# Patient Record
Sex: Male | Born: 1955 | Race: Black or African American | Hispanic: No | Smoking: Heavy tobacco smoker
Health system: Southern US, Community
[De-identification: ages and names within clinical notes are randomized; demographics above are authoritative.]

## PROBLEM LIST (undated history)

## (undated) DIAGNOSIS — E119 Type 2 diabetes mellitus without complications: Secondary | ICD-10-CM

## (undated) DIAGNOSIS — F1910 Other psychoactive substance abuse, uncomplicated: Secondary | ICD-10-CM

## (undated) DIAGNOSIS — T401X1A Poisoning by heroin, accidental (unintentional), initial encounter: Secondary | ICD-10-CM

---

## 2006-07-11 ENCOUNTER — Encounter: Payer: Self-pay | Admitting: Emergency Medicine

## 2006-07-12 ENCOUNTER — Inpatient Hospital Stay (HOSPITAL_COMMUNITY): Admission: EM | Admit: 2006-07-12 | Discharge: 2006-07-14 | Payer: Self-pay | Admitting: Internal Medicine

## 2006-09-04 ENCOUNTER — Ambulatory Visit (HOSPITAL_COMMUNITY): Admission: RE | Admit: 2006-09-04 | Discharge: 2006-09-04 | Payer: Self-pay | Admitting: Urology

## 2006-11-02 ENCOUNTER — Encounter (INDEPENDENT_AMBULATORY_CARE_PROVIDER_SITE_OTHER): Payer: Self-pay | Admitting: Specialist

## 2006-11-02 ENCOUNTER — Ambulatory Visit (HOSPITAL_COMMUNITY): Admission: RE | Admit: 2006-11-02 | Discharge: 2006-11-03 | Payer: Self-pay | Admitting: Urology

## 2007-11-19 ENCOUNTER — Emergency Department (HOSPITAL_COMMUNITY): Admission: EM | Admit: 2007-11-19 | Discharge: 2007-11-19 | Payer: Self-pay | Admitting: Emergency Medicine

## 2009-08-07 IMAGING — CR DG SHOULDER 2+V*L*
3 series · 3 of 3 positions shown · non-contrast
Comparison: none

Left shoulder, 3 views, 11/19/2007, 8552 hours.
INDICATION: EtOH, arm pain, syncope, pain and numbness in both arms into
fingers.

[w shoulder ap internal left]
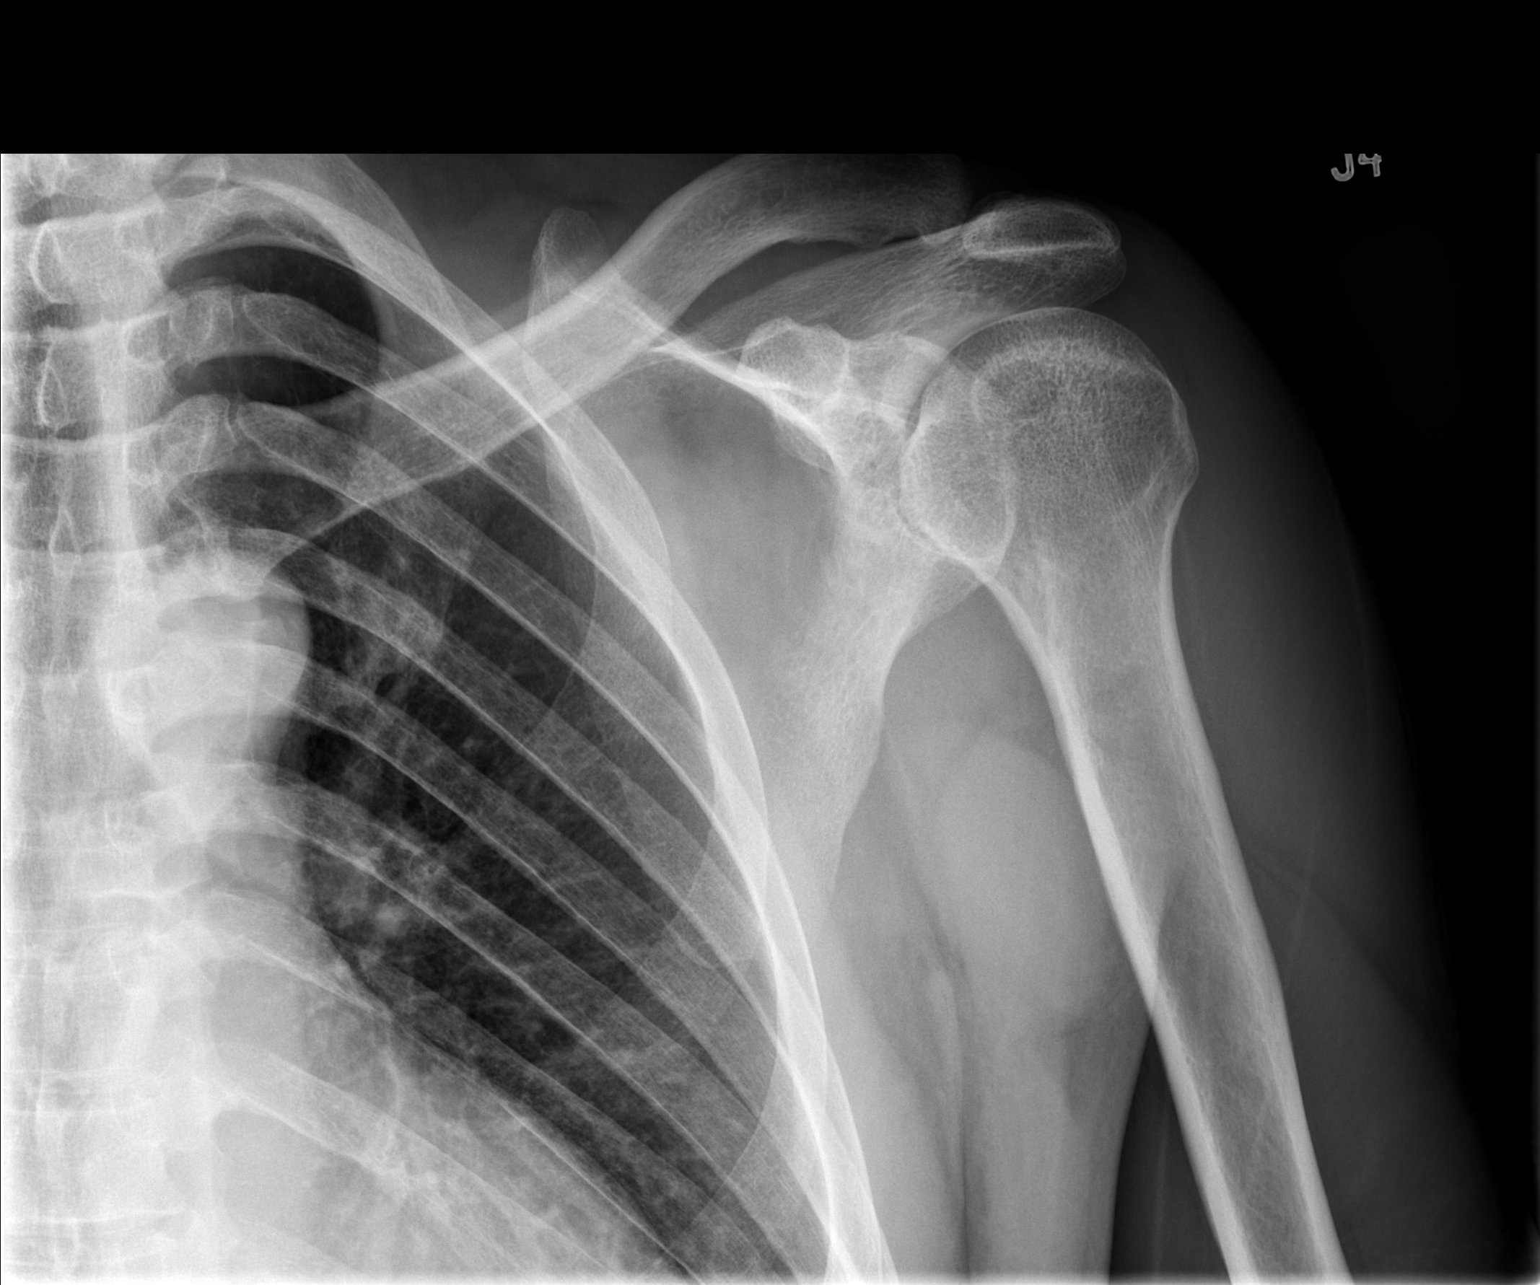

[w shoulder ap external left]
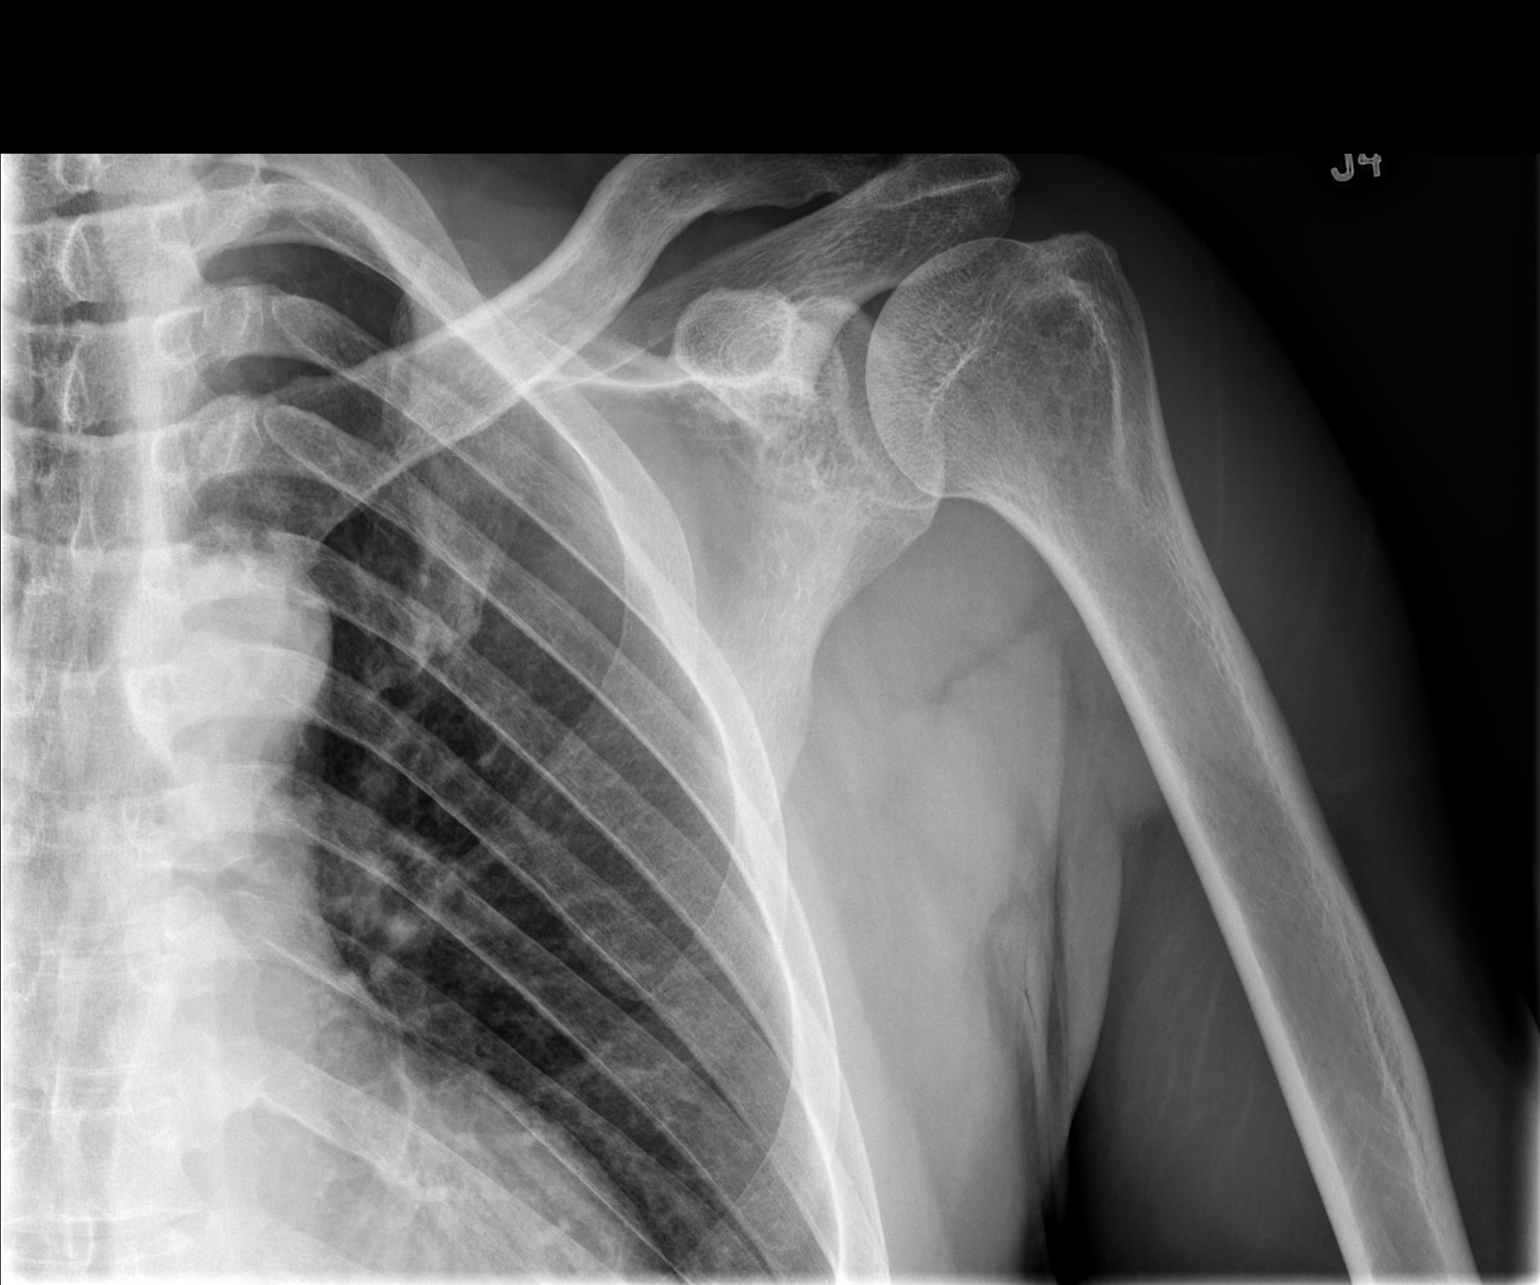

[w shoulder y view left]
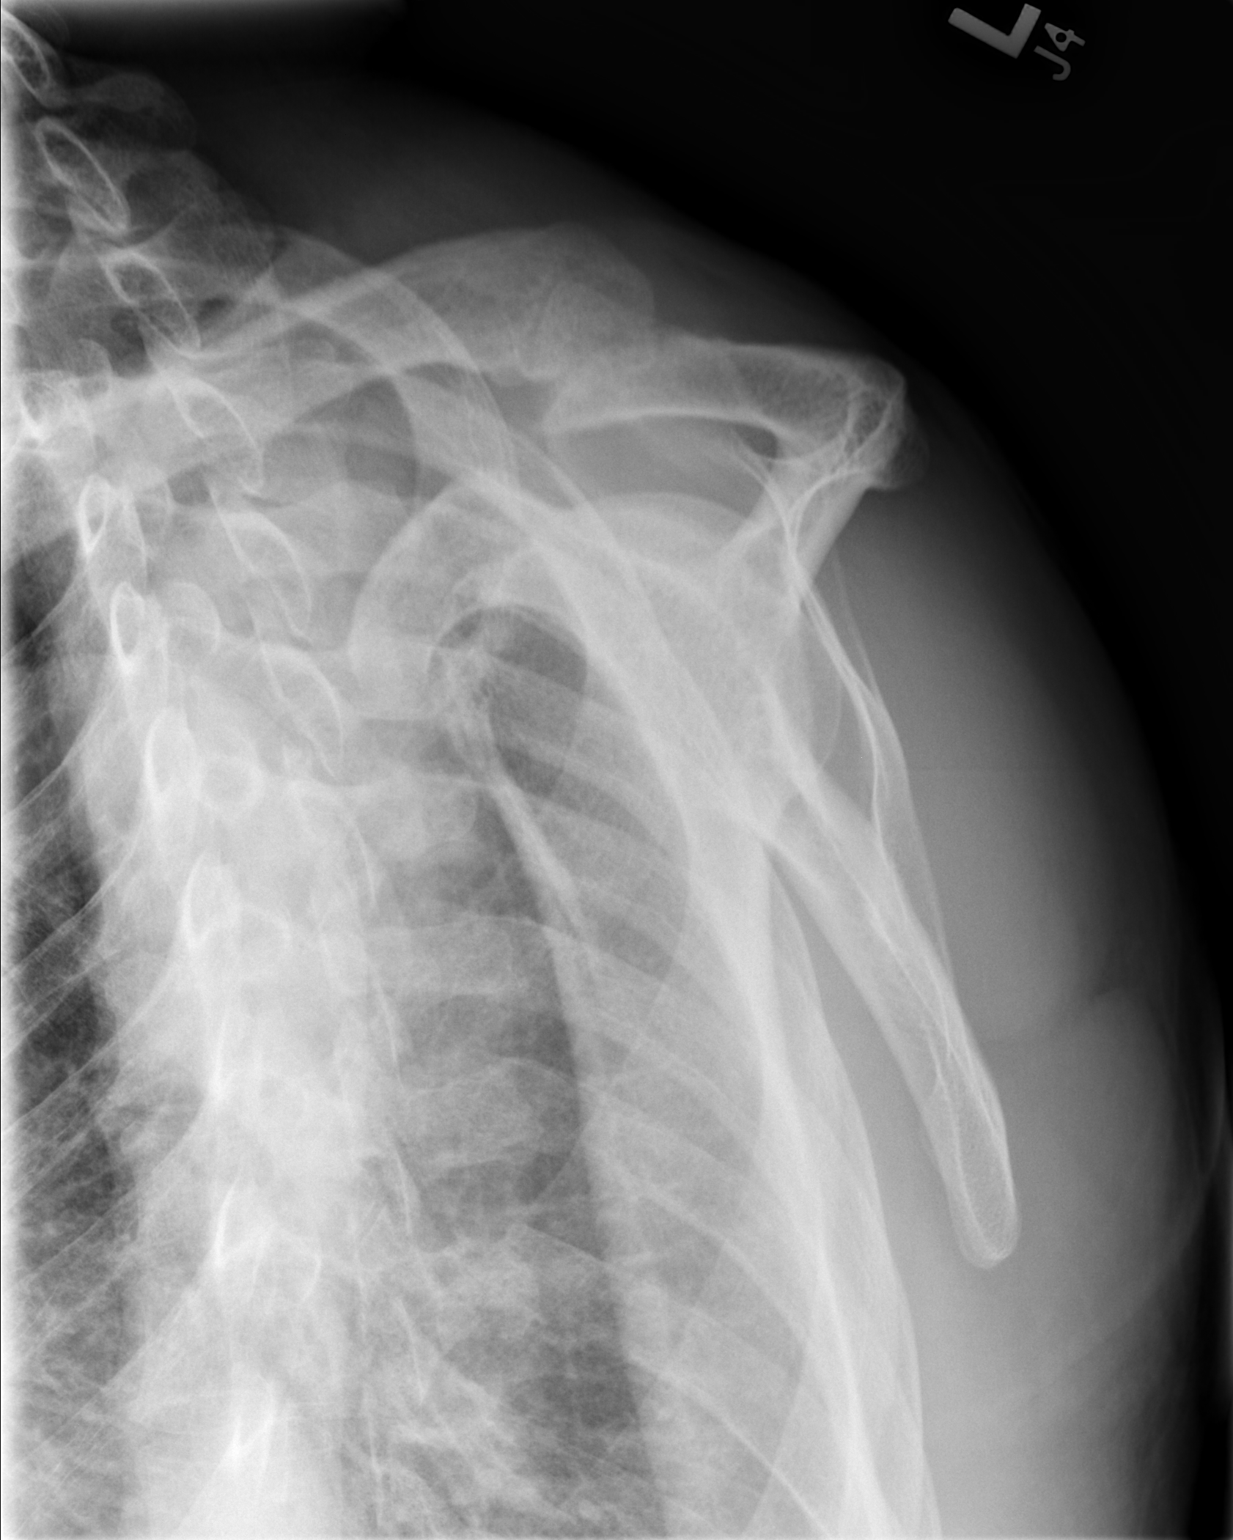

[3 of 3 positions shown; findings below may reference images not displayed]

FINDINGS: Left shoulder located. Internal and external rotation views normal. No
fracture. Type II acromion.
IMPRESSION: No acute injury left shoulder.

## 2010-11-14 ENCOUNTER — Encounter: Payer: Self-pay | Admitting: Urology

## 2011-03-11 NOTE — Op Note (Signed)
Dominic Molina, Dominic Molina              ACCOUNT NO.:  [id number]   MEDICAL RECORD NO.:  [id number]          PATIENT TYPE:  OIB   LOCATION:  0098                         FACILITY:  [facility]   PHYSICIAN:  Heloise Purpura, MD      DATE OF BIRTH:  Sep 16, 1956   DATE OF PROCEDURE:  11/02/2006  DATE OF DISCHARGE:                               OPERATIVE REPORT   PREOPERATIVE DIAGNOSIS:  Bladder tumor.   POSTOPERATIVE DIAGNOSIS:  Bladder tumor.   PROCEDURES:  1. Cystoscopy.  2. Saline bladder washing.  3. Bilateral retrograde pyelography.  4. Transurethral resection of bladder tumor (medium).  5. Biopsy prostatic urethra.  6. Examination under anesthesia.   SURGEON:  Dr. Heloise Purpura.   ANESTHESIA:  General.   COMPLICATIONS:  None.   ESTIMATED BLOOD LOSS:  Minimal.   SPECIMENS:  1. Saline washing for bladder cytology.  2. Bladder tumor.  3. Biopsy of tumor base.  4. Biopsy of prostatic urethra.   INDICATIONS:  Mr. Moncada is a 55 year old gentleman who recently  presented with hematuria.  He underwent evaluation and was found to have  a bladder tumor on the left lateral aspect of his bladder.  After  undergoing thorough counseling, it was explained to him that he did need  to undergo a cystoscopic transurethral resection for staging purposes as  well as further evaluation of his upper urinary tract.  Although the  patient failed to show twice for his surgery and follow-up, he did  decide to finally proceed with a staging evaluation.  Potential risks  and benefits of the above procedures were explained to the patient and  he consented.   DESCRIPTION OF PROCEDURE:  The patient was taken to the operating room  and a general anesthetic was administered.  He was given preoperative  antibiotics, placed in the dorsal lithotomy position, prepped and draped  in the usual sterile fashion.  Preoperative time-out was performed.  Paralysis was administered.  Cystoscopy was performed with the 30  and 70  degrees lens.  This demonstrated a normal anterior urethra.  The  prostatic urethra did demonstrate some frondular tissue in the area of  the verumontanum.  The bladder was then systematically examined.  Both  ureteral orifices were in normal anatomic position and effluxing clear  urine.  There was noted to be a 3-4 cm papillary bladder tumor with a  wide base just lateral to the left ureteral orifice.  The remainder the  bladder was free of any tumors, stones, or other mucosal pathology.  A  saline bladder washing was obtained.  Attention then turned the right  ureteral orifice which was intubated with a 6-French ureteral catheter.  A retrograde pyelogram was performed which did not demonstrate any  abnormalities.  The left ureteral orifice was then also intubated and  injected with contrast and again there were no abnormalities noted.  Attention then turned to resection of the bladder tumor.  The cystoscope  was removed and replaced with the 24-French resectoscope.  Using loupe  cutting resection the bladder tumor was carefully resected down to its  base.  Once all  visible bladder tumor was removed.  The specimen was  sent as bladder tumor.  The cold cup biopsy forceps were then used to  take separate biopsies of the tumor base with deep muscular bites.  Electrocautery was then used to fulgurate any bleeding from the bladder  tumor site.  A 6-French ureteral catheter was used to intubate the  ureteral orifice to ensure no violation of the intravesical ureter.  Attention then turned to the prostatic urethra.  The cold cup biopsy  forceps were used to biopsy the prostatic urethra and aforementioned  frondular tissue in the area of the verumontanum.  These areas were then  fulgurated to provide hemostasis.  The bladder was then reexamined after  it was emptied.  Hemostasis appeared excellent.  The bladder was then  emptied completely and an examination under anesthesia was  performed  which did not demonstrate any pelvic or rectal masses.  The prostate was  approximately 35 grams without nodularity or induration.  A 22-French  three-way catheter was then inserted into the bladder and 40 mg of  mitomycin C was instilled into the bladder and the catheter was plugged.  The patient appeared to tolerate procedure well without complications.  He was able to be awakened and transferred to recovery in satisfactory  condition.   RADIOLOGIC FINDINGS:  Bilateral retrograde pyelography was performed.  There was no evidence of any dilation, filling defects or other  abnormalities on the right retrograde pyelogram.  In addition, there was  no evidence of any filling defects, dilation, or other abnormalities on  the left retrograde pyelogram.           ______________________________  Heloise Purpura, MD  Electronically Signed     LB/MEDQ  D:  11/02/2006  T:  11/02/2006  Job:  [id number]

## 2011-03-11 NOTE — H&P (Signed)
Dominic Molina, Dominic Molina              ACCOUNT NO.:  [id number]   MEDICAL RECORD NO.:  [id number]          PATIENT TYPE:  INP   LOCATION:  4736                         FACILITY:  [facility]   PHYSICIAN:  Merlene Laughter. Renae Gloss, M.D.DATE OF BIRTH:  28-Sep-1956   DATE OF ADMISSION:  07/12/2006  DATE OF DISCHARGE:                                HISTORY & PHYSICAL   The patient is unassigned.   CHIEF COMPLAINT:  Syncope.   HISTORY OF PRESENT ILLNESS:  Dominic Molina is a 55 year old gentleman who  presented to the ED earlier today with complaints of constipation as he has  not had a bowel movement in over 5 days.  He states he has taken multiple  over-the-counter laxatives without any success.  His hemoglobin earlier in  the ED was 7.  He was discharged to home with outpatient GI follow-up as he  was hemodynamically stable the hemoglobin of 7.  However, after going home  he had a syncopal episode and returned to the ED.  Repeat hemoglobin remains  at 7.  He is admitted for further evaluation of significant anemia with  syncopal episode.   PAST MEDICAL HISTORY:  None.   FAMILY HISTORY:  None.   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  He is single.  He reports smoking about a pack of cigarettes a day for the past 30 years.  He denies alcohol or drugs of abuse.   ALLERGIES:  None.   MEDICATIONS:  None.   REVIEW OF SYSTEMS:  No nausea, vomiting, fever, chills.  Otherwise as per  HPI.  Greater than 10 systems were reviewed.   PHYSICAL EXAMINATION:  GENERAL APPEARANCE:  Well-developed, well-nourished  male in no acute distress.  VITAL SIGNS:  Temperature 99.3, pulse 97, respirations 20, blood pressure  127/84.  BUN 16, creatinine 1.3, glucose 116.  White cell count 11.5,  hemoglobin 7.0, platelets 542.  Troponin enzymes less than 0.05, CK-MB 2.3.  HEENT:  No oropharyngeal lesions, poor dentition.  NECK:  Supple, no masses, 2+ carotids, no bruits.  LUNGS:  Clear to  auscultation bilaterally.  CARDIAC:  S1, S2, regular rate and rhythm, no murmurs, rubs or gallops.  ABDOMEN:  Soft, nondistended.  Epigastric abdominal pain, no rebound  tenderness, no guarding.  EXTREMITIES:  No clubbing, cyanosis, or edema.  RECTAL:  Guaiac-negative per EDP.  SKIN:  Warm, intact.  NEUROLOGIC:  Alert and oriented x3.  Cranial nerves intact.   X-ray abdominal series:  Results pending.   ASSESSMENT AND PLAN:  Syncope with significant anemia.  Dominic Molina will be  admitted to telemetry; however, I suspect his syncopal episode is most  likely due to his anemia.  Currently he is hemodynamically stable.  An  anemia evaluation will be obtained and the patient also will require GI  consultation during this admission.  Possible etiologies include peptic  ulcer disease, gastritis, colonic mass.  The patient will be transfused 2  units upon admission to the floor.           ______________________________  Merlene Laughter Renae Gloss, M.D.     KRS/MEDQ  D:  07/12/2006  T:  07/13/2006  Job:  [id number]

## 2011-03-11 NOTE — H&P (Signed)
NAMEKIAM, BRANSFIELD              ACCOUNT NO.:  [id number]   MEDICAL RECORD NO.:  [id number]          PATIENT TYPE:  OIB   LOCATION:  0098                         FACILITY:  [facility]   PHYSICIAN:  Heloise Purpura, MD      DATE OF BIRTH:  1956-01-25   DATE OF ADMISSION:  11/02/2006  DATE OF DISCHARGE:                              HISTORY & PHYSICAL   CHIEF COMPLAINT:  Bladder tumor.   HISTORY:  Mr. Hoffmeier is a 55 year old gentleman who was initially seen in  urologic consultation by Dr. Alfredo Martinez at [facility]  after he was evaluated for a syncopal episode.  He was found to be  severely anemic and did have some GI bleeding.  He was also noted to  have hematuria.  He underwent an evaluation and a blood transfusion and  stabilized.  He was subsequently arranged to have both an outpatient  urologic and gastroenterology workup.  He not show up for his  gastroenterology evaluation.  He initially showed up for his urologic  evaluation and was seen by myself to discuss treatment for his bladder  tumor.  I explained to the patient that he did need to undergo a staging  procedure with cystoscopy and transurethral resection of his bladder  tumor.  However, the patient failed to show for his surgical date.  In  addition, he failed to show for an office visit.  He told me that he was  fearful of his diagnosis and outlook.  He subsequently decided to  proceed with surgery.  However, on the day of surgery he did have a  toxicology screen which was positive for cannabis and cocaine.  After  confronting the patient, he assured both myself and his  anesthesiologist, Dr. Rica Mast, that he has not used any illicit drugs in  the past 72 hours.  We explained to the patient that this could  potentially be life-threatening if he had recently used illicit drugs  and was to undergo a general anesthetic.  However, the patient was  adamant that he had not used any recent drugs and did wish to  proceed  with his surgery.   PAST MEDICAL HISTORY:  None except for anemia of uncertain source.   PAST SURGICAL HISTORY:  None.   MEDICATIONS:  None.   ALLERGIES:  No known drug allergies.   FAMILY HISTORY:  Positive for hypertension, diabetes and coronary artery  disease as well as kidney stones.  No history of GU malignancy.   SOCIAL HISTORY:  The patient works as a Administrator.  He is single.  He  has smoked cigarettes for 23 years and states that he quit a few months  ago.  He does use drugs as stated above.   REVIEW OF SYSTEMS:  Positive for a 15-20 pound weight loss over the past  few months.  He also has a history of blurred vision, hematuria.  He  denies any history of venothromboembolism.   PHYSICAL EXAMINATION:  CONSTITUTIONAL:  A well-nourished, well-  developed, age-appropriate male in no acute distress.  CARDIOVASCULAR:  Regular rate and rhythm without obvious murmurs.  LUNGS:  Clear bilaterally.  ABDOMEN:  Soft, nontender, nondistended, without abdominal masses.  BACK:  No CVA tenderness.  GU:  Normal male.  EXTREMITIES:  No edema.   LABORATORY STUDIES:  Hemoglobin 13.5.  Creatinine 1.2.   IMPRESSION:  Bladder tumor.   PLAN:  Mr. Kratochvil has been thoroughly informed of the risks of proceeding  with the surgery today.  He again adamantly denies any illicit drug use  over at least the past 72 hours.  He does wish to proceed with this  procedure, which will consist of cystoscopy, examination under  anesthesia, bilateral retrograde pyelography, and transurethral  resection of his bladder tumor.  He will then be admitted to the  hospital for routine postoperative care.           ______________________________  Heloise Purpura, MD  Electronically Signed     LB/MEDQ  D:  11/02/2006  T:  11/02/2006  Job:  [id number]

## 2011-03-11 NOTE — Discharge Summary (Signed)
Dominic Molina, Dominic Molina              ACCOUNT NO.:  [id number]   MEDICAL RECORD NO.:  [id number]          PATIENT TYPE:  INP   LOCATION:  4736                         FACILITY:  [facility]   PHYSICIAN:  Marcellus Scott, MD     DATE OF BIRTH:  October 29, 1955   DATE OF ADMISSION:  07/12/2006  DATE OF DISCHARGE:  07/14/2006                                 DISCHARGE SUMMARY   PRIMARY CARE PHYSICIAN:  Patient does not have a primary care physician and,  therefore, is unassigned at [facility].  However, he has an  appointment now to be seen at the [facility] with  Dr. Marliss Czar; and the address there is [address]  [place], [place], [address].  Gastroenterologist is Dr. Evette Cristal with  [facility], and urologist is Dr. Alfredo Martinez.   DISCHARGE DIAGNOSES:  1. Syncope secondary to severe symptomatic anemia.  2. Symptomatic severe iron deficiency anemia.  3. Hematuria secondary to possible bladder cancer.  4. Left lower lobe lung nodule on CT scan.  5. History of substance abuse.   DISCHARGE MEDICATIONS:  1. Protonix 40 mg p.o. daily.  2. Ferrous sulfate 325 mg p.o. 3 times daily.  3. Senna 2 tablets p.o. q.h.s.  4. Nicotine transdermal 21-mg patch 1 daily for 6 weeks, and then he is to      follow up with the primary care physician for the tapered dose of the      nicotine transdermal patch.   PROCEDURES DONE DURING THIS ADMISSION:  1. CT of the abdomen with contrast.  The impression was no acute findings      in the abdomen.  A 3-mm nodule in the left lower lobe.  CT scan of the      chest without contrast may prove helpful to evaluate for other      parenchymal nodules.  2. Pelvic CT with contrast.  Both the abdominal and pelvic CTs were done      on the 19th of September 2007.  The impression of this is a 2.2-cm soft      tissue lesion in the posterolateral left bladder worrisome for      neoplasm.  3. Acute abdomen series with chest  done on the 18th of September 2007.      Impression was no acute abnormalities.  4. Again, acute abdomen series with chest also on the 18th of September      2007.  Impression is nonobstructive bowel gas pattern.  No free air and      no acute cardiopulmonary disease.   CONSULTATIONS OBTAINED DURING THIS ADMISSION:  1. GI by Dr. Evette Cristal of [facility].  Please see consultation notes for further      details.  2. Urology consult was obtained from Dr. Alfredo Martinez.  Also, please      see his consult notes for further details.   HOSPITAL COURSE AND PATIENT'S CONDITION ON DISCHARGE:  1. Syncope.  For the details of emergency room presentation, please see      the history and physical note of Dr. Andi Devon  done on July 12, 2006.  In summary, Dominic Molina, a 55 year old African American male,      presented to the emergency room at [facility] on the 19th of      September with a 5-day history of constipation which was not resolving      with multiple over-the-counter laxatives.  He is said to have been seen      there at the emergency room and discharged on some laxatives and a      hemoglobin of 7 which was at that time asymptomatic.  However, while      returning home at approximately 4 p.m., while at the bus stop patient      felt dizzy and then had syncope, but denied any preceding chest pain,      palpitations, incontinence; and he presented to the emergency room      where he was again found to have a hemoglobin of 7 and was admitted for      further evaluation.  This syncope was thought to be secondary to his      severe anemia; and following the correction of his anemia, his symptoms      have resolved and he has not had any further episodes, even on      ambulation.   1. Severe symptomatic iron deficiency anemia.  As per #1, patient on      admission to the hospital was noted with a hemoglobin of 7 which      subsequently dropped to a hemoglobin of 6.7 and a  hematocrit of 20 with      an MCV of 73.  So it was assessed as a microcytic anemia for which an      evaluation was started.  His stool fecal occult blood testing was      negative.  His iron studies are suggestive of a severe iron deficiency      anemia with an iron of 16, a total iron-binding capacity of 342, a      percent saturation of 5, and a ferritin of 6.  His B12 is 326.  His      folate is 763.  His hemoglobin electrophoresis is still pending per the      hematology lab.  Patient did not have any signs of overt      gastrointestinal bleeding, like melena or blood in the stools or      hematemesis.  He did, however, say that he had been having intermittent      gross hematuria for the past 4 weeks.  Patient was transfused 3 units      of packed red blood cells with resolution of his symptoms.  Patient is      now stable with a hemoglobin today of 9.4, hematocrit of 28.3, white      cell of 8.2, platelets of 397,000.  A GI consult was obtained from Dr.      Evette Cristal who is going to follow him up as an outpatient for an EGD as well      as a colonoscopy.  I have called Dr. Luan Moore office and have left the      patient's telephone number for them to call him to make the      appointment.  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  The urologists also      have provided an appointment to the patient so that he can followed up      with them next week to have a cystoscopy and biopsy as an outpatient.      Patient is being discharged on ferrous sulfate, and an appointment has      been made for him to see a physician (with repeat CBC) at the     [facility] because he does not have a  primary      care physician.   1. Hematuria.  As indicated in #2, the patient has intermittent gross      hematuria for the last 3 to 4 weeks.  CT of the bladder suggested a      bladder mass.  Urologists were consulted, and he is to have a      cystoscopy as an outpatient with the urologists.  Patient's PSA done      during this hospital admission was normal at 0.17.   1. A left lower lobe lung nodule on CT, which was an incidental finding.      This has to be followed up with the primary care physician and      evaluated as deemed appropriate and necessary.   1. History of substance abuse.  Patient has been counseled regarding      abstinence from cocaine.  He says he has not used it in a while.   1. History of tobacco smoking.  Patient has been counseled regarding      abstaining from smoking, and he has willingly accepted the nicotine      transdermal patch; and this has to be followed up with the primary      physician regarding the tapering dose after 6 weeks.   1. Chronic kidney disease.  Patient has a creatinine of 1.3.  I do not      know what his prior creatinines were, and this has to be followed up      with a repeat basic metabolic panel.      Marcellus Scott, MD  Electronically Signed     AH/MEDQ  D:  07/14/2006  T:  07/14/2006  Job:  [id number]   cc:   Graylin Shiver, M.D.  Martina Sinner, MD

## 2011-03-11 NOTE — Consult Note (Signed)
Dominic Molina, Dominic Molina              ACCOUNT NO.:  [id number]   MEDICAL RECORD NO.:  [id number]          PATIENT TYPE:  INP   LOCATION:  4736                         FACILITY:  [facility]   PHYSICIAN:  Graylin Shiver, M.D.   DATE OF BIRTH:  1956/05/09   DATE OF CONSULTATION:  07/12/2006  DATE OF DISCHARGE:                                   CONSULTATION   CHIEF COMPLAINT:  Anemia, syncope, weight loss.   HISTORY OF PRESENT ILLNESS:  Dominic Molina is a 55 year old African American  male who is a poly-substance abuser with cocaine and marijuana who presented  to the ED on July 11, 2006, with constipation x5  days.  He was given  laxatives, evaluated, and discharged home.  On the way home, he was changing  buses, waiting for the second bus, when he lost consciousness and  subsequently returned to the ER.  The patient reports blood clots on  urination for the last two weeks to one month intermittently.  He denies any  pain, fever or recent illness.  He has no history of hematuria.  He has no  history of prostate cancer. He also reports a weight loss of 30 pounds over  the last 2-3 months due to a decrease in appetite. He says his bowel  movements were normal until approximately 5-6 days ago then constipation set  in.  He tried Metamucil and various laxatives, but they were no help.  He  has no history of constipation.  He denies any nausea, vomiting, diarrhea,  reflux or abdominal pain. He has no history of colon cancer in the family.  He denies any melena, hematochezia, or hematemesis.   PAST MEDICAL HISTORY:  He denies any chronic disease.  He says he has had no  surgeries.  He also reports that he has had no colonoscopy or endoscopy.   SOCIAL HISTORY:  Positive for marijuana and cocaine.  He says he has used  cocaine for the last 10-15 years.  He reports that he has smoked 1/2 pack  cigarettes per day for last 20-25 years.  He denies alcohol use.  He lives  alone.  He is a Administrator.   FAMILY HISTORY:  His mother recently died.  She had severe cardiac history  but he could not tell me her cause of death.  His father passed away years  ago with a stroke and a heart attack.  He has no family history of GI  cancers.   REVIEW OF SYSTEMS:  Basically noncontributory, but he denies headache, sore  throat, muscle aches, and any other complaints of pain   CURRENT MEDICATIONS:  He uses no medications at home.  In the hospital, is  on a nicotine patch, Protonix, docusate sodium, Tylenol, Phenergan, and  oxycodone.   ALLERGIES:  No known allergies.   PHYSICAL EXAMINATION:  GENERAL:  The patient is alert and oriented in no apparent distress lying in  bed.  VITAL SIGNS:  Temperature 98.6, pulse 96, respirations 30, and blood  pressure is 144/95.  HEENT:  His eyes were PERRL.  His sclerae are clear.  HEART:  Regular rate and rhythm with no murmurs, rubs or gallops.  LUNGS:  Clear to auscultation anteriorly.  ABDOMEN:  Very thin, soft, nontender, nondistended.  Positive bowel sounds.  Negative for bruits and negative for masses.  SKIN:  No rashes or lesions.  EXTREMITIES:  No edema.   CURRENT LABS:  His hemoglobin is 6.7, MCV is 72.6, white blood count 10.3.  His Chem-7 was within normal limits.  His GI enzymes were all within normal  limits.   ASSESSMENT/PLAN:  This is a 55 year old male suffering from syncope due to  microcytic anemia that is possibly secondary to blood loss via the urinary  tract.  He has no obvious signs of GI bleed.  This could be from long term  cocaine use, infection, or some type of bladder or renal cell cancer.  Dr.  Evette Cristal saw and evaluated the patient.  He recommends a urological consult. We  will continue to follow the patient.  Thank you very much for this  consultation.      Stephani Police, PA    ______________________________  Graylin Shiver, M.D.    MLY/MEDQ  D:  07/12/2006  T:  07/13/2006  Job:  [id number]   cc:   Marcellus Scott,  MD

## 2011-03-11 NOTE — Discharge Summary (Signed)
NAMEBOWDEN, BOODY              ACCOUNT NO.:  [id number]   MEDICAL RECORD NO.:  [id number]          PATIENT TYPE:  OIB   LOCATION:  1442                         FACILITY:  [facility]   PHYSICIAN:  Heloise Purpura, MD      DATE OF BIRTH:  1956-09-25   DATE OF ADMISSION:  11/02/2006  DATE OF DISCHARGE:  11/03/2006                               DISCHARGE SUMMARY   ADMISSION DIAGNOSIS:  Bladder tumor.   DISCHARGE DIAGNOSIS:  Bladder tumor.   HISTORY:  For full details, please see admission history and physical.  Briefly, Mr. Lamm is a 55 year old gentleman who was found to have a  bladder tumor after an evaluation for hematuria.  He was counseled  regarding the necessary diagnostic and staging evaluation of cystoscopy  with transurethral resection of his bladder tumor.   HOSPITAL COURSE:  On November 02, 2006, the patient was taken to the  operating room and underwent cystoscopy, retrograde pyelography and  transurethral resection of his bladder tumor.  He tolerated this  procedure well and postoperatively was able to be transferred to a  regular hospital room. He was monitored for one night with a Foley  catheter left indwelling.  On postoperative day #1, his urine was  clearing and his catheter was able to be removed.  He subsequently  passed a voiding trial and was able to be discharged home.  He remained  stable throughout his hospital course.   DISPOSITION:  Home.   DISCHARGE MEDICATIONS:  He was given a prescription to take Darvocet as  needed for pain and Bactrim as an antibiotic.   DISCHARGE INSTRUCTIONS:  He was told to refrain from any heavy lifting  or strenuous activity.   FOLLOW UP:  He will follow-up in 2 weeks to discuss the surgical  pathology in detail.           ______________________________  Heloise Purpura, MD  Electronically Signed     LB/MEDQ  D:  11/03/2006  T:  11/03/2006  Job:  [id number]

## 2011-07-14 LAB — DIFFERENTIAL
Basophils Absolute: 0
Basophils Relative: 0
Eosinophils Absolute: 0.1
Eosinophils Relative: 1
Lymphocytes Relative: 19
Lymphs Abs: 1.4
Monocytes Absolute: 0.5
Monocytes Relative: 7
Neutro Abs: 5.2
Neutrophils Relative %: 73

## 2011-07-14 LAB — BASIC METABOLIC PANEL
CO2: 28
Calcium: 9
Creatinine, Ser: 1.43
GFR calc non Af Amer: 52 — ABNORMAL LOW
Glucose, Bld: 105 — ABNORMAL HIGH
Sodium: 137

## 2011-07-14 LAB — POCT CARDIAC MARKERS
CKMB, poc: 2.7
Myoglobin, poc: 94.9
Operator id: 3067
Troponin i, poc: <0.05

## 2011-07-14 LAB — CBC
MCHC: 34.6
MCV: 76.5 — ABNORMAL LOW
Platelets: 283
RDW: 17.2 — ABNORMAL HIGH

## 2013-04-18 ENCOUNTER — Encounter (HOSPITAL_COMMUNITY): Payer: Self-pay | Admitting: Emergency Medicine

## 2013-04-18 ENCOUNTER — Emergency Department (HOSPITAL_COMMUNITY)
Admission: EM | Admit: 2013-04-18 | Discharge: 2013-04-19 | Disposition: A | Discharge status: Home / Self Care | Payer: Self-pay | Attending: Emergency Medicine | Admitting: Emergency Medicine

## 2013-04-18 DIAGNOSIS — F17200 Nicotine dependence, unspecified, uncomplicated: Secondary | ICD-10-CM | POA: Insufficient documentation

## 2013-04-18 DIAGNOSIS — Y939 Activity, unspecified: Secondary | ICD-10-CM | POA: Insufficient documentation

## 2013-04-18 DIAGNOSIS — Y929 Unspecified place or not applicable: Secondary | ICD-10-CM | POA: Insufficient documentation

## 2013-04-18 DIAGNOSIS — T401X4A Poisoning by heroin, undetermined, initial encounter: Secondary | ICD-10-CM | POA: Insufficient documentation

## 2013-04-18 DIAGNOSIS — F1910 Other psychoactive substance abuse, uncomplicated: Secondary | ICD-10-CM

## 2013-04-18 DIAGNOSIS — T401X1A Poisoning by heroin, accidental (unintentional), initial encounter: Secondary | ICD-10-CM | POA: Insufficient documentation

## 2013-04-18 LAB — CBC
HCT: 35.2 % — ABNORMAL LOW (ref 39.0–52.0)
MCH: 25.8 pg — ABNORMAL LOW (ref 26.0–34.0)
MCV: 73.3 fL — ABNORMAL LOW (ref 78.0–100.0)
RDW: 17.8 % — ABNORMAL HIGH (ref 11.5–15.5)
WBC: 12.1 10*3/uL — ABNORMAL HIGH (ref 4.0–10.5)

## 2013-04-18 MED ORDER — SODIUM CHLORIDE 0.9 % IV BOLUS (SEPSIS)
1000.0000 mL | Freq: Once | INTRAVENOUS | Status: AC
  Administered 2013-04-19: 1000 mL via INTRAVENOUS

## 2013-04-18 NOTE — ED Notes (Signed)
Pt admits to be a crack cocaine smoker on a regular basis.

## 2013-04-18 NOTE — ED Notes (Signed)
Pt arrived via EMS with a chief complaint of a heroin overdose.  Per EMS ventilation was required for approx. 5 minutes.   Pt given 2mg  of Narcan and pt became responsive.  Pt states he usually doesn't take heroin but attempted to take 0.2 mg of heroin.  Pt alert and orientated and able to relate events with clarity.

## 2013-04-18 NOTE — ED Notes (Signed)
[id number]<AV> Expected date:<BR> Expected time:<BR> Means of arrival:<BR> Comments:<BR> EMS/57 yo

## 2013-04-18 NOTE — ED Provider Notes (Signed)
   History    CSN: [id number] Arrival date & time 04/18/13  2251  First MD Initiated Contact with Patient 04/18/13 2314     Chief Complaint  Patient presents with  . Drug Overdose   (Consider location/radiation/quality/duration/timing/severity/associated sxs/prior Treatment) HPI Pt presents with c/o heroin overdose.  Pt was brought in by EMS and they state he was unresponsive and not breathing. He was given 2mg  narcan and became responsive.  He required BVM for approx 5 minutes.  He states he normally takes crack but tonight tried heroin.  Denies SI, states he was experimenting.  He denies any current complaints.  Denies taking other substances tonight.  There are no other associated systemic symptoms, there are no other alleviating or modifying factors.  History reviewed. No pertinent past medical history. History reviewed. No pertinent past surgical history. History reviewed. No pertinent family history. History  Substance Use Topics  . Smoking status: Heavy Tobacco Smoker  . Smokeless tobacco: Not on file  . Alcohol Use: Yes    Review of Systems ROS reviewed and all otherwise negative except for mentioned in HPI  Allergies  Review of patient's allergies indicates no known allergies.  Home Medications  No current outpatient prescriptions on file. BP 170/94  Pulse 85  Temp(Src) 97.6 F (36.4 C) (Oral)  Resp 18  SpO2 99% Vitals reviewed Physical Exam  Physical Examination: General appearance - alert, well appearing, and in no distress Mental status - alert, oriented to person, place, and time Eyes - pupils equal and reactive, extraocular eye movements intact Mouth - mucous membranes moist, pharynx normal without lesions Chest - clear to auscultation, no wheezes, rales or rhonchi, symmetric air entry Heart - normal rate, regular rhythm, normal S1, S2, no murmurs, rubs, clicks or gallops Abdomen - soft, nontender, nondistended, no masses or organomegaly Extremities -  peripheral pulses normal, no pedal edema, no clubbing or cyanosis Skin - normal coloration and turgor, no rashes, no suspicious skin lesions noted Psych- calm and cooperative, denies SI/HI  ED Course  Procedures (including critical care time) Labs Reviewed  CBC - Abnormal; Notable for the following:    WBC 12.1 (*)    Hemoglobin 12.4 (*)    HCT 35.2 (*)    MCV 73.3 (*)    MCH 25.8 (*)    RDW 17.8 (*)    All other components within normal limits  BASIC METABOLIC PANEL - Abnormal; Notable for the following:    Glucose, Bld 217 (*)    Creatinine, Ser 1.42 (*)    GFR calc non Af Amer 53 (*)    GFR calc Af Amer 61 (*)    All other components within normal limits  URINE RAPID DRUG SCREEN (HOSP PERFORMED) - Abnormal; Notable for the following:    Opiates POSITIVE (*)    Cocaine POSITIVE (*)    Tetrahydrocannabinol POSITIVE (*)    All other components within normal limits  ETHANOL   No results found. 1. Overdose of heroin, initial encounter   2. Substance abuse     MDM  Pt presenting after accidental heroin overdose.  He was given narcan by EMS and in the ED has been awake and alert.  Initially tachycardic but this has resolved with fluids.  He has been observed for several hours and has no current complaints.  Discharged with strict return precautions.  Pt agreeable with plan.    Ethelda Chick, MD 04/19/13 [phone]

## 2013-04-19 LAB — BASIC METABOLIC PANEL
BUN: 16 mg/dL (ref 6–23)
Calcium: 8.8 mg/dL (ref 8.4–10.5)
Chloride: 104 mEq/L (ref 96–112)
Creatinine, Ser: 1.42 mg/dL — ABNORMAL HIGH (ref 0.50–1.35)
GFR calc Af Amer: 61 mL/min — ABNORMAL LOW (ref >90)

## 2013-04-19 LAB — RAPID URINE DRUG SCREEN, HOSP PERFORMED
Amphetamines: NOT DETECTED
Tetrahydrocannabinol: POSITIVE — AB

## 2013-04-19 LAB — ETHANOL: Alcohol, Ethyl (B): <11 mg/dL (ref 0–11)

## 2016-12-17 ENCOUNTER — Encounter (HOSPITAL_COMMUNITY): Payer: Self-pay | Admitting: Emergency Medicine

## 2016-12-17 ENCOUNTER — Emergency Department (HOSPITAL_COMMUNITY): Payer: Self-pay

## 2016-12-17 ENCOUNTER — Inpatient Hospital Stay (HOSPITAL_COMMUNITY)
Admission: EM | Admit: 2016-12-17 | Discharge: 2016-12-18 | DRG: 918 | Disposition: A | Discharge status: Home / Self Care | Payer: Self-pay | Attending: Nephrology | Admitting: Nephrology

## 2016-12-17 DIAGNOSIS — N183 Chronic kidney disease, stage 3 (moderate): Secondary | ICD-10-CM | POA: Diagnosis present

## 2016-12-17 DIAGNOSIS — Z79899 Other long term (current) drug therapy: Secondary | ICD-10-CM

## 2016-12-17 DIAGNOSIS — N179 Acute kidney failure, unspecified: Secondary | ICD-10-CM | POA: Diagnosis present

## 2016-12-17 DIAGNOSIS — F17200 Nicotine dependence, unspecified, uncomplicated: Secondary | ICD-10-CM | POA: Diagnosis present

## 2016-12-17 DIAGNOSIS — F17210 Nicotine dependence, cigarettes, uncomplicated: Secondary | ICD-10-CM

## 2016-12-17 DIAGNOSIS — R6510 Systemic inflammatory response syndrome (SIRS) of non-infectious origin without acute organ dysfunction: Secondary | ICD-10-CM

## 2016-12-17 DIAGNOSIS — F1910 Other psychoactive substance abuse, uncomplicated: Secondary | ICD-10-CM | POA: Diagnosis present

## 2016-12-17 DIAGNOSIS — T401X1A Poisoning by heroin, accidental (unintentional), initial encounter: Principal | ICD-10-CM | POA: Diagnosis present

## 2016-12-17 DIAGNOSIS — A419 Sepsis, unspecified organism: Secondary | ICD-10-CM | POA: Diagnosis present

## 2016-12-17 DIAGNOSIS — T68XXXA Hypothermia, initial encounter: Secondary | ICD-10-CM | POA: Diagnosis present

## 2016-12-17 DIAGNOSIS — E872 Acidosis, unspecified: Secondary | ICD-10-CM | POA: Diagnosis present

## 2016-12-17 DIAGNOSIS — E875 Hyperkalemia: Secondary | ICD-10-CM | POA: Diagnosis present

## 2016-12-17 DIAGNOSIS — T401X4A Poisoning by heroin, undetermined, initial encounter: Secondary | ICD-10-CM

## 2016-12-17 DIAGNOSIS — F1490 Cocaine use, unspecified, uncomplicated: Secondary | ICD-10-CM | POA: Diagnosis present

## 2016-12-17 DIAGNOSIS — F19129 Other psychoactive substance abuse with intoxication, unspecified: Secondary | ICD-10-CM | POA: Diagnosis present

## 2016-12-17 DIAGNOSIS — E1122 Type 2 diabetes mellitus with diabetic chronic kidney disease: Secondary | ICD-10-CM | POA: Diagnosis present

## 2016-12-17 DIAGNOSIS — E119 Type 2 diabetes mellitus without complications: Secondary | ICD-10-CM

## 2016-12-17 HISTORY — DX: Poisoning by heroin, accidental (unintentional), initial encounter: T40.1X1A

## 2016-12-17 HISTORY — DX: Other psychoactive substance abuse, uncomplicated: F19.10

## 2016-12-17 HISTORY — DX: Type 2 diabetes mellitus without complications: E11.9

## 2016-12-17 LAB — COMPREHENSIVE METABOLIC PANEL
ALK PHOS: 88 U/L (ref 38–126)
ALT: 61 U/L (ref 17–63)
ANION GAP: 12 (ref 5–15)
AST: 95 U/L — ABNORMAL HIGH (ref 15–41)
Albumin: 3.6 g/dL (ref 3.5–5.0)
BUN: 21 mg/dL — ABNORMAL HIGH (ref 6–20)
CALCIUM: 9 mg/dL (ref 8.9–10.3)
CO2: 22 mmol/L (ref 22–32)
Chloride: 107 mmol/L (ref 101–111)
Creatinine, Ser: 2.15 mg/dL — ABNORMAL HIGH (ref 0.61–1.24)
GFR, EST AFRICAN AMERICAN: 36 mL/min — AB (ref >60)
GFR, EST NON AFRICAN AMERICAN: 31 mL/min — AB (ref >60)
Glucose, Bld: 199 mg/dL — ABNORMAL HIGH (ref 65–99)
Potassium: 5.5 mmol/L — ABNORMAL HIGH (ref 3.5–5.1)
SODIUM: 141 mmol/L (ref 135–145)
Total Bilirubin: 0.6 mg/dL (ref 0.3–1.2)
Total Protein: 7.1 g/dL (ref 6.5–8.1)

## 2016-12-17 LAB — BASIC METABOLIC PANEL
Anion gap: 7 (ref 5–15)
BUN: 20 mg/dL (ref 6–20)
CALCIUM: 8 mg/dL — AB (ref 8.9–10.3)
CO2: 25 mmol/L (ref 22–32)
CREATININE: 1.76 mg/dL — AB (ref 0.61–1.24)
Chloride: 110 mmol/L (ref 101–111)
GFR, EST AFRICAN AMERICAN: 46 mL/min — AB (ref >60)
GFR, EST NON AFRICAN AMERICAN: 40 mL/min — AB (ref >60)
Glucose, Bld: 85 mg/dL (ref 65–99)
Potassium: 3.8 mmol/L (ref 3.5–5.1)
Sodium: 142 mmol/L (ref 135–145)

## 2016-12-17 LAB — URINALYSIS, ROUTINE W REFLEX MICROSCOPIC
BILIRUBIN URINE: NEGATIVE
Bacteria, UA: NONE SEEN
Glucose, UA: 50 mg/dL — AB
Ketones, ur: NEGATIVE mg/dL
Leukocytes, UA: NEGATIVE
Nitrite: NEGATIVE
PH: 5 (ref 5.0–8.0)
Protein, ur: 30 mg/dL — AB
SPECIFIC GRAVITY, URINE: 1.015 (ref 1.005–1.030)

## 2016-12-17 LAB — I-STAT CG4 LACTIC ACID, ED
LACTIC ACID, VENOUS: 3.62 mmol/L — AB (ref 0.5–1.9)
LACTIC ACID, VENOUS: 2.4 mmol/L — AB (ref 0.5–1.9)

## 2016-12-17 LAB — CBC WITH DIFFERENTIAL/PLATELET
BASOS PCT: 0 %
Basophils Absolute: 0 10*3/uL (ref 0.0–0.1)
EOS ABS: 0 10*3/uL (ref 0.0–0.7)
Eosinophils Relative: 0 %
HCT: 39.1 % (ref 39.0–52.0)
HEMOGLOBIN: 13.8 g/dL (ref 13.0–17.0)
Lymphocytes Relative: 7 %
Lymphs Abs: 1.4 10*3/uL (ref 0.7–4.0)
MCH: 26.5 pg (ref 26.0–34.0)
MCHC: 35.3 g/dL (ref 30.0–36.0)
MCV: 75.2 fL — AB (ref 78.0–100.0)
Monocytes Absolute: 1 10*3/uL (ref 0.1–1.0)
Monocytes Relative: 5 %
NEUTROS ABS: 17.1 10*3/uL — AB (ref 1.7–7.7)
Neutrophils Relative %: 88 %
Platelets: 223 10*3/uL (ref 150–400)
RBC: 5.2 MIL/uL (ref 4.22–5.81)
RDW: 17.3 % — ABNORMAL HIGH (ref 11.5–15.5)
WBC: 19.5 10*3/uL — ABNORMAL HIGH (ref 4.0–10.5)

## 2016-12-17 LAB — GLUCOSE, CAPILLARY
GLUCOSE-CAPILLARY: 114 mg/dL — AB (ref 65–99)
Glucose-Capillary: 120 mg/dL — ABNORMAL HIGH (ref 65–99)
Glucose-Capillary: 73 mg/dL (ref 65–99)
Glucose-Capillary: 81 mg/dL (ref 65–99)

## 2016-12-17 LAB — RAPID URINE DRUG SCREEN, HOSP PERFORMED
AMPHETAMINES: NOT DETECTED
BENZODIAZEPINES: NOT DETECTED
Barbiturates: NOT DETECTED
Cocaine: POSITIVE — AB
OPIATES: POSITIVE — AB
TETRAHYDROCANNABINOL: POSITIVE — AB

## 2016-12-17 LAB — ETHANOL

## 2016-12-17 LAB — MRSA PCR SCREENING: MRSA BY PCR: NEGATIVE

## 2016-12-17 LAB — CBG MONITORING, ED: Glucose-Capillary: 222 mg/dL — ABNORMAL HIGH (ref 65–99)

## 2016-12-17 MED ORDER — VANCOMYCIN HCL IN DEXTROSE 1-5 GM/200ML-% IV SOLN
1000.0000 mg | Freq: Once | INTRAVENOUS | Status: AC
Start: 2016-12-17 — End: 2016-12-17
  Administered 2016-12-17: 1000 mg via INTRAVENOUS
  Filled 2016-12-17: qty 200

## 2016-12-17 MED ORDER — SODIUM CHLORIDE 0.9 % IV BOLUS (SEPSIS)
1000.0000 mL | Freq: Once | INTRAVENOUS | Status: AC
Start: 2016-12-17 — End: 2016-12-17
  Administered 2016-12-17: 1000 mL via INTRAVENOUS

## 2016-12-17 MED ORDER — VANCOMYCIN HCL IN DEXTROSE 1-5 GM/200ML-% IV SOLN
1000.0000 mg | INTRAVENOUS | Status: DC
  Administered 2016-12-18: 1000 mg via INTRAVENOUS
  Filled 2016-12-17: qty 200

## 2016-12-17 MED ORDER — INSULIN ASPART 100 UNIT/ML ~~LOC~~ SOLN: 0.0000 [IU] | Freq: Three times a day (TID) | SUBCUTANEOUS | Status: DC

## 2016-12-17 MED ORDER — SODIUM CHLORIDE 0.9 % IV BOLUS (SEPSIS)
1000.0000 mL | Freq: Once | INTRAVENOUS | Status: AC
  Administered 2016-12-17: 1000 mL via INTRAVENOUS

## 2016-12-17 MED ORDER — SODIUM CHLORIDE 0.45 % IV SOLN
INTRAVENOUS | Status: DC
  Administered 2016-12-17 (×3): via INTRAVENOUS

## 2016-12-17 MED ORDER — CLONIDINE HCL 0.1 MG PO TABS: 0.1000 mg | ORAL_TABLET | Freq: Three times a day (TID) | ORAL | Status: DC | PRN

## 2016-12-17 MED ORDER — ENOXAPARIN SODIUM 40 MG/0.4ML ~~LOC~~ SOLN
40.0000 mg | SUBCUTANEOUS | Status: DC
  Administered 2016-12-17: 40 mg via SUBCUTANEOUS
  Filled 2016-12-17: qty 0.4

## 2016-12-17 MED ORDER — PIPERACILLIN-TAZOBACTAM 3.375 G IVPB
3.3750 g | Freq: Three times a day (TID) | INTRAVENOUS | Status: DC
  Administered 2016-12-17 – 2016-12-18 (×4): 3.375 g via INTRAVENOUS
  Filled 2016-12-17 (×7): qty 50

## 2016-12-17 MED ORDER — PIPERACILLIN-TAZOBACTAM 3.375 G IVPB 30 MIN
3.3750 g | Freq: Once | INTRAVENOUS | Status: AC
  Administered 2016-12-17: 3.375 g via INTRAVENOUS
  Filled 2016-12-17: qty 50

## 2016-12-17 NOTE — ED Provider Notes (Addendum)
TIME SEEN:  By signing my name below, I, Arianna Nassar, attest that this documentation has been prepared under the direction and in the presence of [employer], DO.  Electronically Signed: Octavia Heir, ED Scribe. 12/17/16. 1:55 AM.   CHIEF COMPLAINT:  Chief Complaint  Patient presents with  . Drug Overdose    Heroin    HPI:  HPI Comments: Dominic Molina is a 61 y.o. male with history of substance abuse who presents to the Emergency Department presenting for an overdose on heroin. Pt states he snorted one line of heroin this evening ~ 7:30 and took one hit of a rock of cocaine. Pt does not have any current pain. This was not an attempt to harm himself. He states he has been using cocaine and heroin for many years. Per EMS, family noted he was unresponsive and slumped over in a chair. It is noted pt was combative when touched and he had some residual confusion. He received 2 mg of IM narcan and 1 mg of IV narcan with EMS. Found to be hypothermic here.  He denies SI, HI, AH, VH, fever, cough, vomiting, diarrhea, chest pain, or shortness of breath.   ROS: See HPI Constitutional: no fever  Eyes: no drainage  ENT: no runny nose   Cardiovascular:  no chest pain  Resp: no SOB  GI: no vomiting GU: no dysuria Integumentary: no rash  Allergy: no hives  Musculoskeletal: no leg swelling  Neurological: no slurred speech ROS otherwise negative  PAST MEDICAL HISTORY/PAST SURGICAL HISTORY:  History reviewed. No pertinent past medical history.  MEDICATIONS:  Prior to Admission medications   Not on File    ALLERGIES:  No Known Allergies  SOCIAL HISTORY:  Social History  Substance Use Topics  . Smoking status: Heavy Tobacco Smoker  . Smokeless tobacco: Never Used  . Alcohol use Yes    FAMILY HISTORY: No family history on file.  EXAM: BP (!) 151/116 (BP Location: Right Arm)   Pulse 106   Resp 17   Ht 5\' 7"  (1.702 m)   Wt 155 lb (70.3 kg)   BMI 24.28 kg/m   CONSTITUTIONAL: Alert and oriented x 3 and responds appropriately to questions. Chronically ill-appearing but in no distress, hypothermic HEAD: Normocephalic EYES: Conjunctivae clear, PERRL, EOMI ENT: normal nose; no rhinorrhea; dry mucous membranes NECK: Supple, no meningismus, no nuchal rigidity, no LAD  CARD: Regular and mild tachycardic; S1 and S2 appreciated; no murmurs, no clicks, no rubs, no gallops RESP: Normal chest excursion without splinting or tachypnea; breath sounds clear and equal bilaterally; no wheezes, no rhonchi, no rales, no hypoxia or respiratory distress, speaking full sentences ABD/GI: Normal bowel sounds; non-distended; soft, non-tender, no rebound, no guarding, no peritoneal signs, no hepatosplenomegaly BACK:  The back appears normal and is non-tender to palpation, there is no CVA tenderness EXT: Normal ROM in all joints; non-tender to palpation; no edema; normal capillary refill; no cyanosis, no calf tenderness or swelling    SKIN: Normal color for age and race; warm; no rash NEURO: Moves all extremities equally, sensation to light touch intact diffusely, cranial nerves II through XII intact, normal speech PSYCH: The patient's mood and manner are appropriate. Grooming and personal hygiene are appropriate. No SI, HI, AH, or VH  MEDICAL DECISION MAKING: Patient here with overdose. Found to be tachycardic and hypothermic here but otherwise normotensive. Discussed with patient that this could be sepsis versus being down for several hours from an overdose. He denies that it  was intentional. At this time he agrees with workup but states he wants to go home. We'll give IV fluids, obtain labs, cultures, chest x-ray. He is mentating normally nail and is oriented 3 without complaints.  ED PROGRESS: Patient has a leukocytosis of 19,000. Also has elevated lactate. Because of these findings we will give him broad-spectrum antibody for possible sepsis. No source of infection  currently.  2:59 AM Expressed to pt that he needs antibiotics and admission to the hospital. Discussed that he has life threatening symptoms that could cause possible death if not admitted for further evaluation.  Patient states that he wants to go home but quickly falls back asleep when I'm talking to him. He appears still intoxicated but does not need further Narcan or Narcan drip at this time. I do not feel he has capacity to make this decision for himself. Urine pending.  4:20 AM  Pt's urine does not show obvious infection. X-ray positive for opiates, cocaine and THC. Patient has intermittent desats on room air but will quickly popped back up on his own into the 90s. Have discussed patient with Dr. Robb Matarrtiz with hospital service as I feel he does not have capacity to make decision for himself at this time and needs admission. Hospitalist will see patient place admission orders.  I have written IVC paperwork.  I reviewed all nursing notes, vitals, pertinent old records, EKGs, labs, imaging (as available).        Layla MawKristen N Anysia Choi, DO 12/17/16 0422    Layla MawKristen N Sosaia Pittinger, DO 12/17/16 [phone]

## 2016-12-17 NOTE — Progress Notes (Signed)
Pharmacy Antibiotic Note  Dominic Molina is a 61 y.o. male admitted on 12/17/2016 with sepsis.  Pharmacy has been consulted for Vancomycin and Zosyn dosing.  Zosyn 3.375gm IV given ~0300 and Vancomycin 1gm IV given ~0400  Plan: Zosyn 3.375gm IV q8h - doses over 4 hours Vancomycin 1gm IV q24h Will f/u micro data, renal function, and pt's clinical condition Vanc trough prn   Height: 5\' 7"  (170.2 cm) Weight: 155 lb (70.3 kg) IBW/kg (Calculated) : 66.1  Temp (24hrs), Avg:95.4 F (35.2 C), Min:93 F (33.9 C), Max:98 F (36.7 C)   Recent Labs Lab 12/17/16 0209 12/17/16 0233  WBC 19.5*  --   CREATININE 2.15*  --   LATICACIDVEN  --  3.62*    Estimated Creatinine Clearance: 33.3 mL/min (by C-G formula based on SCr of 2.15 mg/dL (H)).    No Known Allergies  Antimicrobials this admission: 2/24 Vanc >>  2/24 Zosyn >>   Dose adjustments this admission: n/a  Microbiology results: 2/24 BCx x2:  2/24 UCx:    Thank you for allowing pharmacy to be a part of this patient's care.  Dominic Molina, PharmD, BCPS Clinical pharmacist, pager [phone] 12/17/2016 4:31 AM

## 2016-12-17 NOTE — ED Notes (Signed)
Dr Elesa MassedWard given a copy of lactic acid results 2.40

## 2016-12-17 NOTE — Progress Notes (Addendum)
Patient seen and examined  61 y.o. male with medical history significant of polysubstance abuse, tobacco use disorder who was brought to the emergency department via EMS after he was found by family member slumped over a chair.he received 2 mg I a.m.  and 1 mg IV Narcan by EMS. He was initially hypothermic with a temperature of 28F. A warming blanket was ordered for hypothermia. His WBC was 19.5 with 88% neutrophils, hemoglobin 13.8 g/dL and platelets [id number]. Patient admitted for polysubstance overdose   Assessment and plan Heroin overdose Mental status has improved, but still the patient is somnolent. The patient wanted to leave AMA, but I do not believe that the patient has the capacity to do so. Patient is now under involuntary commitment.   Continue stepdown UDS positive for opiates, cocaine, marijuana PSYCHE CONSULT REQUESTED       Sepsis due to undetermined organism (HCC) His white count to be reactive, but given hypothermia,   started on broad-spectrum IV antibiotics. Follow-up blood cultures and sensitivity. Patient is high risk for bacteremia.    Hyperkalemia Likely as a result of lactic acidosis. Continue IV fluids. Follow-up potassium level.    Lactic acidosis Continue IV fluids. Lactic acid level.    AKI (acute kidney injury) (HCC), chronic kidney disease stage III Baseline creatinine likely around 1.4, although no creatinine values in the system since June 2014 Continue IV fluids. Follow-up creatinine level. Cr improving ,will recheck in am     Type 2 diabetes mellitus (HCC) Carbohydrate modified diet. CBG monitoring with regular insulin sliding scale. Check hemoglobin A1c      Hypothermia Continue warming blankets. Continue IV antibiotics.      Polysubstance abuse Advised to seek detox and counseling. Monitor for signs of withdrawals. The patient declined nicotine replacement therapy.

## 2016-12-17 NOTE — H&P (Signed)
History and Physical    Dominic BambergBenjamin A Mcelroy ZOX:[id number] DOB: 09/27/1956 DOA: 12/17/2016  PCP: No PCP Per Patient   Patient coming from: Home  Chief Complaint: heroine overdose.  HPI: Dominic Molina is a 61 y.o. male with medical history significant of polysubstance abuse, tobacco use disorder who was brought to the emergency department via EMS after he was found by family member slumped over a chair.he received 2 mg I a.m.  and 1 mg IV Narcan by EMS. The patient is still somnolent and stated that he does not remember much of what happened. He states that he probably snorted heroine around 1930 yesterday. He denies fever, chills, rhinorrhea, sore throat, productive cough, chest pain, dyspnea, palpitations, diaphoresis, pitting edema of the lower extremities, PND or orthopnea. He denies abdominal pain, nausea, emesis, diarrhea, melena, dysuria or frequency.  ED Course: The patient received normal saline liter bolus, vancomycin and Zosyn. He was initially hypothermic with a temperature of 6F. A warming blanket was ordered for hypothermia. His WBC was 19.5 with 88% neutrophils, hemoglobin 13.8 g/dL and platelets [id number]. Urinalysis showed 5-30 WBC, but negative nitrates or leukocyte esterase. Lactic acid 3.62, sodium 141, potassium 5.5, chloride 107, bicarbonate 22 mmol/L. BUN was 21, creatinine 2.15 up from 1.42 three years ago, EtOH less than 5, glucose 199 mg/dL. His chest radiograph did not reveal any acute pathology.  Review of Systems: As per HPI otherwise 10 point review of systems negative.    Past Medical History:  Diagnosis Date  . Diabetes mellitus without complication (HCC)   . Heroin overdose 12/17/2016  . Polysubstance abuse     History reviewed. No pertinent surgical history.   reports that he has been smoking.  He has never used smokeless tobacco. He reports that he drinks alcohol. He reports that he uses drugs, including Cocaine, Marijuana, and Heroin.  No Known  Allergies  Family History  Problem Relation Age of Onset  . Hypertension Other      Prior to Admission medications   Not on File    Physical Exam:  Constitutional: NAD, calm, comfortable Vitals:   12/17/16 0215 12/17/16 0315 12/17/16 0400 12/17/16 0424  BP: 150/98 132/89 134/83   Pulse: 102 102 96   Resp: 14 17 13    Temp:    98 F (36.7 C)  TempSrc:    Oral  SpO2: 92% 93% 90%   Weight:      Height:       Eyes: PERRL, lids and conjunctivae normal ENMT: Mucous membranes are moist. Posterior pharynx clear of any exudate or lesions.Dentures. Neck: normal, supple, no masses, no thyromegaly Respiratory: clear to auscultation bilaterally, no wheezing, no crackles. Normal respiratory effort. No accessory muscle use.  Cardiovascular: Regular rate and rhythm, no murmurs / rubs / gallops. No extremity edema. 2+ pedal pulses. No carotid bruits.  Abdomen: Soft, no tenderness, no masses palpated. No hepatosplenomegaly. Bowel sounds positive.  Musculoskeletal: no clubbing / cyanosis.  Good ROM, no contractures. Normal muscle tone.  Skin: no rashes, lesions, ulcers. Neurologic: CN 2-12 grossly intact. Sensation intact, DTR normal. Strength 5/5 in all 4.  Psychiatric: Somnolent, but arousable. Awake alert oriented 2, partially oriented to time and to situation.    Labs on Admission: I have personally reviewed following labs and imaging studies  CBC:  Recent Labs Lab 12/17/16 0209  WBC 19.5*  NEUTROABS 17.1*  HGB 13.8  HCT 39.1  MCV 75.2*  PLT 223   Basic Metabolic Panel:  Recent Labs Lab 12/17/16  0209  NA 141  K 5.5*  CL 107  CO2 22  GLUCOSE 199*  BUN 21*  CREATININE 2.15*  CALCIUM 9.0   GFR: Estimated Creatinine Clearance: 33.3 mL/min (by C-G formula based on SCr of 2.15 mg/dL (H)). Liver Function Tests:  Recent Labs Lab 12/17/16 0209  AST 95*  ALT 61  ALKPHOS 88  BILITOT 0.6  PROT 7.1  ALBUMIN 3.6   No results for input(s): LIPASE, AMYLASE in the  last 168 hours. No results for input(s): AMMONIA in the last 168 hours. Coagulation Profile: No results for input(s): INR, PROTIME in the last 168 hours. Cardiac Enzymes: No results for input(s): CKTOTAL, CKMB, CKMBINDEX, TROPONINI in the last 168 hours. BNP (last 3 results) No results for input(s): PROBNP in the last 8760 hours. HbA1C: No results for input(s): HGBA1C in the last 72 hours. CBG:  Recent Labs Lab 12/17/16 0155  GLUCAP 222*   Lipid Profile: No results for input(s): CHOL, HDL, LDLCALC, TRIG, CHOLHDL, LDLDIRECT in the last 72 hours. Thyroid Function Tests: No results for input(s): TSH, T4TOTAL, FREET4, T3FREE, THYROIDAB in the last 72 hours. Anemia Panel: No results for input(s): VITAMINB12, FOLATE, FERRITIN, TIBC, IRON, RETICCTPCT in the last 72 hours. Urine analysis:    Component Value Date/Time   COLORURINE YELLOW 12/17/2016 0320   APPEARANCEUR HAZY (A) 12/17/2016 0320   LABSPEC 1.015 12/17/2016 0320   PHURINE 5.0 12/17/2016 0320   GLUCOSEU 50 (A) 12/17/2016 0320   HGBUR MODERATE (A) 12/17/2016 0320   BILIRUBINUR NEGATIVE 12/17/2016 0320   KETONESUR NEGATIVE 12/17/2016 0320   PROTEINUR 30 (A) 12/17/2016 0320   NITRITE NEGATIVE 12/17/2016 0320   LEUKOCYTESUR NEGATIVE 12/17/2016 0320    Radiological Exams on Admission: Dg Chest Portable 1 View  Result Date: 12/17/2016 CLINICAL DATA:  Initial evaluation for hypothermia, heroin overdose. EXAM: PORTABLE CHEST 1 VIEW COMPARISON:  Prior radiograph from 11/19/2007. FINDINGS: Examination tightening limited by patient positioning. Allowing for position, cardiac and mediastinal silhouettes are grossly stable, and remain within normal limits. Lungs mildly hypoinflated. Underlying chronic lung changes are similar. No focal infiltrates to suggest infection or aspiration. No pulmonary edema. No definite pleural effusion, although the left costophrenic angle is incompletely visualized. No pneumothorax. Asymmetric opacity at  the right lung apex favored to reflect great vessel attenuation due to patient positioning. No acute osseous abnormality. IMPRESSION: No active cardiopulmonary disease identified. Electronically Signed   By: Rise Mu M.D.   On: 12/17/2016 02:26     Assessment/Plan Principal Problem:   Heroin overdose Mental status has improved, but still the patient is somnolent. The patient wanted to leave AMA, but I do not believe that the patient has the capacity to do so. He was IVCed by Dr. Elesa Massed and I agree with her decision. Admit to stepdown/observation. Continue supplemental oxygen and close monitoring.  Active Problems:   Sepsis due to undetermined organism (HCC) His white count to be reactive, but given hypothermia, I agree with broad-spectrum IV antibiotics. Follow-up blood cultures and sensitivity. Patient is high risk for bacteremia.    Hyperkalemia Likely as a result of lactic acidosis. Continue IV fluids. Follow-up potassium level.    Lactic acidosis Continue IV fluids. Lactic acid level.    AKI (acute kidney injury) (HCC) Continue IV fluids. Follow-up creatinine level.    Type 2 diabetes mellitus (HCC) Carbohydrate modified diet. CBG monitoring with regular insulin sliding scale.      Hypothermia Continue warming blankets. Continue IV antibiotics.      Polysubstance abuse  Advised to seek detox and counseling. Monitor for signs of withdrawals. The patient declined nicotine replacement therapy.     DVT prophylaxis: Lovenox SQ. Code Status: Full code. Family Communication: Disposition Plan: Admit to SDU for close monitoring. Consults called:  Admission status: Observation/Stepdown.   Bobette Mo MD [facility] Pager [phone].  If 7PM-7AM, please contact night-coverage [email or web address] Password [facility]  12/17/2016, 4:31 AM

## 2016-12-17 NOTE — Progress Notes (Signed)
Upon assessment pt is alert, oriented, cooperative. States: "I just like to be me and do what I like to do. I use it for 30 years. Some people drink bear, I don't drink beer, I do not drink and drive, I do not use drugs and drive. I know with each action there is a consequence, and this is what I choose to do. I told cops that I do not want to be here and I have right, but they told me they will make me. So here I am, here, hungry, thirsty and pissed off because I want to go home" Emotional support and education about risks of polysubstance use was given. Pt verbalized understanding and "do not need anyone tell him how to live his life. I am not suicidal, I want to live my life." It was explained to the pt about the IVC papers. Pt calm and cooperative.  MD spoke to the patient. Safety sitter at the bedside.

## 2016-12-17 NOTE — Progress Notes (Signed)
Report was given. Pt transported to 5W21.VS stable, no distress noted.

## 2016-12-17 NOTE — ED Notes (Signed)
MD at bedside. 

## 2016-12-17 NOTE — ED Triage Notes (Signed)
Pt arrives via EMS for heroin overdose today, states he snorts heroin. Family noted that he was unresponsive, slumped over in a chair. RR4, but combative when touched. Some residual confusion. Received 2MG  IM narcan and 1MG  IV narcan. Pt alert x2, knows that he's at the hospital but doesn't know date/year/[place] president.

## 2016-12-17 NOTE — ED Notes (Signed)
Admitting MD at bedside.

## 2016-12-17 NOTE — ED Notes (Signed)
Provided PO fluids per Dr. Elesa MassedWard.

## 2016-12-17 NOTE — Consult Note (Addendum)
[facility] Face-to-Face Psychiatry Consult   Reason for Consult:  Polysubstance abuse Referring Physician:  Dr. Allyson Sabal Patient Identification: Dominic Molina MRN:  [id number] Principal Diagnosis: Heroin overdose Diagnosis:   Patient Active Problem List   Diagnosis Date Noted  . Heroin overdose [T40.1X1A] 12/17/2016  . Hyperkalemia [E87.5] 12/17/2016  . Polysubstance abuse [F19.10] 12/17/2016  . AKI (acute kidney injury) ([place]) [N17.9] 12/17/2016  . Type 2 diabetes mellitus ([place]) [E11.9] 12/17/2016  . Sepsis due to undetermined organism ([place]) [A41.9] 12/17/2016  . Hypothermia [T68.XXXA] 12/17/2016  . Lactic acidosis [E87.2] 12/17/2016    Total Time spent with patient: 45 minutes  Subjective:   Dominic Molina is a 61 y.o. male patient admitted with Heroine overdose.  HPI: Thanks for asking me to do a psychiatric consultation on Dominic Molina, a 61 y.o. male  who denies prior history of mental illness but endorsed long history of substance use disorder. Patient reports that he accidentally overdosed on Heroin last night-claimed he snorted just one line of heroin and his sister called EMS after she  found him slumped over in the chair;''she thought I had a stroke''. In addition, patient reports that he hit rock of cocaine and smoked some Cannabis. However, he made it clear that he was not trying to kill himself. Patient denies prior history or family history of suicide attempt. He also denies delusions, psychosis or depressive symptoms. He verbalizes that his only stressor is taking care of his sister who has stroke. Patient is requesting to be discharged soon, he agreed with at least 24 hrs observation in the hospital.  Past Psychiatric History: denies  Risk to Self: Is patient at risk for suicide?: No Risk to Others:   Prior Inpatient Therapy:   Prior Outpatient Therapy:    Past Medical History:  Past Medical History:  Diagnosis Date  . Diabetes mellitus without complication  ([place])   . Heroin overdose 12/17/2016  . Polysubstance abuse    History reviewed. No pertinent surgical history. Family History:  Family History  Problem Relation Age of Onset  . Hypertension Other    Family Psychiatric  History:  Social History:  History  Alcohol Use  . Yes     History  Drug Use  . Types: Cocaine, Marijuana, Heroin    Comment: heroin    Social History   Social History  . Marital status: Divorced    Spouse name: N/A  . Number of children: N/A  . Years of education: N/A   Social History Main Topics  . Smoking status: Heavy Tobacco Smoker  . Smokeless tobacco: Never Used  . Alcohol use Yes  . Drug use: Yes    Types: Cocaine, Marijuana, Heroin     Comment: heroin  . Sexual activity: Yes   Other Topics Concern  . None   Social History Narrative  . None   Additional Social History:    Allergies:  No Known Allergies  Labs:  Results for orders placed or performed during the hospital encounter of 12/17/16 (from the past 48 hour(s))  CBG monitoring, ED     Status: Abnormal   Collection Time: 12/17/16  1:55 AM  Result Value Ref Range   Glucose-Capillary 222 (H) 65 - 99 mg/dL  CBC with Differential     Status: Abnormal   Collection Time: 12/17/16  2:09 AM  Result Value Ref Range   WBC 19.5 (H) 4.0 - 10.5 K/uL   RBC 5.20 4.22 - 5.81 MIL/uL   Hemoglobin 13.8  13.0 - 17.0 g/dL   HCT 39.1 39.0 - 52.0 %   MCV 75.2 (L) 78.0 - 100.0 fL   MCH 26.5 26.0 - 34.0 pg   MCHC 35.3 30.0 - 36.0 g/dL   RDW 17.3 (H) 11.5 - 15.5 %   Platelets 223 150 - 400 K/uL   Neutrophils Relative % 88 %   Lymphocytes Relative 7 %   Monocytes Relative 5 %   Eosinophils Relative 0 %   Basophils Relative 0 %   Neutro Abs 17.1 (H) 1.7 - 7.7 K/uL   Lymphs Abs 1.4 0.7 - 4.0 K/uL   Monocytes Absolute 1.0 0.1 - 1.0 K/uL   Eosinophils Absolute 0.0 0.0 - 0.7 K/uL   Basophils Absolute 0.0 0.0 - 0.1 K/uL   RBC Morphology POLYCHROMASIA PRESENT     Comment: TARGET CELLS   Comprehensive metabolic panel     Status: Abnormal   Collection Time: 12/17/16  2:09 AM  Result Value Ref Range   Sodium 141 135 - 145 mmol/L   Potassium 5.5 (H) 3.5 - 5.1 mmol/L   Chloride 107 101 - 111 mmol/L   CO2 22 22 - 32 mmol/L   Glucose, Bld 199 (H) 65 - 99 mg/dL   BUN 21 (H) 6 - 20 mg/dL   Creatinine, Ser 2.15 (H) 0.61 - 1.24 mg/dL   Calcium 9.0 8.9 - 10.3 mg/dL   Total Protein 7.1 6.5 - 8.1 g/dL   Albumin 3.6 3.5 - 5.0 g/dL   AST 95 (H) 15 - 41 U/L   ALT 61 17 - 63 U/L   Alkaline Phosphatase 88 38 - 126 U/L   Total Bilirubin 0.6 0.3 - 1.2 mg/dL   GFR calc non Af Amer 31 (L) >60 mL/min   GFR calc Af Amer 36 (L) >60 mL/min    Comment: (NOTE) The eGFR has been calculated using the CKD EPI equation. This calculation has not been validated in all clinical situations. eGFR's persistently <60 mL/min signify possible Chronic Kidney Disease.    Anion gap 12 5 - 15  Ethanol     Status: None   Collection Time: 12/17/16  2:09 AM  Result Value Ref Range   Alcohol, Ethyl (B) <5 <5 mg/dL    Comment:        LOWEST DETECTABLE LIMIT FOR SERUM ALCOHOL IS 5 mg/dL FOR MEDICAL PURPOSES ONLY   I-Stat CG4 Lactic Acid, ED     Status: Abnormal   Collection Time: 12/17/16  2:33 AM  Result Value Ref Range   Lactic Acid, Venous 3.62 (HH) 0.5 - 1.9 mmol/L   Comment NOTIFIED PHYSICIAN   Urinalysis, Routine w reflex microscopic     Status: Abnormal   Collection Time: 12/17/16  3:20 AM  Result Value Ref Range   Color, Urine YELLOW YELLOW   APPearance HAZY (A) CLEAR   Specific Gravity, Urine 1.015 1.005 - 1.030   pH 5.0 5.0 - 8.0   Glucose, UA 50 (A) NEGATIVE mg/dL   Hgb urine dipstick MODERATE (A) NEGATIVE   Bilirubin Urine NEGATIVE NEGATIVE   Ketones, ur NEGATIVE NEGATIVE mg/dL   Protein, ur 30 (A) NEGATIVE mg/dL   Nitrite NEGATIVE NEGATIVE   Leukocytes, UA NEGATIVE NEGATIVE   RBC / HPF 0-5 0 - 5 RBC/hpf   WBC, UA 6-30 0 - 5 WBC/hpf   Bacteria, UA NONE SEEN NONE SEEN   Squamous  Epithelial / LPF 0-5 (A) NONE SEEN   Mucous PRESENT    Hyaline Casts, UA PRESENT  Rapid urine drug screen (hospital performed)     Status: Abnormal   Collection Time: 12/17/16  3:20 AM  Result Value Ref Range   Opiates POSITIVE (A) NONE DETECTED   Cocaine POSITIVE (A) NONE DETECTED   Benzodiazepines NONE DETECTED NONE DETECTED   Amphetamines NONE DETECTED NONE DETECTED   Tetrahydrocannabinol POSITIVE (A) NONE DETECTED   Barbiturates NONE DETECTED NONE DETECTED    Comment:        DRUG SCREEN FOR MEDICAL PURPOSES ONLY.  IF CONFIRMATION IS NEEDED FOR ANY PURPOSE, NOTIFY LAB WITHIN 5 DAYS.        LOWEST DETECTABLE LIMITS FOR URINE DRUG SCREEN Drug Class       Cutoff (ng/mL) Amphetamine      1000 Barbiturate      200 Benzodiazepine   119 Tricyclics       417 Opiates          300 Cocaine          300 THC              50   I-Stat CG4 Lactic Acid, ED     Status: Abnormal   Collection Time: 12/17/16  5:31 AM  Result Value Ref Range   Lactic Acid, Venous 2.40 (HH) 0.5 - 1.9 mmol/L   Comment NOTIFIED PHYSICIAN   MRSA PCR Screening     Status: None   Collection Time: 12/17/16  8:31 AM  Result Value Ref Range   MRSA by PCR NEGATIVE NEGATIVE    Comment:        The GeneXpert MRSA Assay (FDA approved for NASAL specimens only), is one component of a comprehensive MRSA colonization surveillance program. It is not intended to diagnose MRSA infection nor to guide or monitor treatment for MRSA infections.   Glucose, capillary     Status: Abnormal   Collection Time: 12/17/16  8:57 AM  Result Value Ref Range   Glucose-Capillary 120 (H) 65 - 99 mg/dL   Comment 1 Notify RN    Comment 2 Document in Chart   Basic metabolic panel     Status: Abnormal   Collection Time: 12/17/16  9:52 AM  Result Value Ref Range   Sodium 142 135 - 145 mmol/L   Potassium 3.8 3.5 - 5.1 mmol/L   Chloride 110 101 - 111 mmol/L   CO2 25 22 - 32 mmol/L   Glucose, Bld 85 65 - 99 mg/dL   BUN 20 6 - 20 mg/dL    Creatinine, Ser 1.76 (H) 0.61 - 1.24 mg/dL   Calcium 8.0 (L) 8.9 - 10.3 mg/dL   GFR calc non Af Amer 40 (L) >60 mL/min   GFR calc Af Amer 46 (L) >60 mL/min    Comment: (NOTE) The eGFR has been calculated using the CKD EPI equation. This calculation has not been validated in all clinical situations. eGFR's persistently <60 mL/min signify possible Chronic Kidney Disease.    Anion gap 7 5 - 15  Glucose, capillary     Status: None   Collection Time: 12/17/16 12:21 PM  Result Value Ref Range   Glucose-Capillary 73 65 - 99 mg/dL    Current Facility-Administered Medications  Medication Dose Route Frequency Provider Last Rate Last Dose  . 0.45 % sodium chloride infusion   Intravenous Continuous Reubin Milan, MD 150 mL/hr at 12/17/16 1219    . cloNIDine (CATAPRES) tablet 0.1 mg  0.1 mg Oral Q8H PRN Corena Pilgrim, MD      . enoxaparin (LOVENOX) injection  40 mg  40 mg Subcutaneous Q24H Reubin Milan, MD      . insulin aspart (novoLOG) injection 0-9 Units  0-9 Units Subcutaneous TID WC Reyne Dumas, MD      . piperacillin-tazobactam (ZOSYN) IVPB 3.375 g  3.375 g Intravenous Q8H Franky Macho, RPH 12.5 mL/hr at 12/17/16 1032 3.375 g at 12/17/16 1032  . [START ON 12/18/2016] vancomycin (VANCOCIN) IVPB 1000 mg/200 mL premix  1,000 mg Intravenous Q24H Franky Macho, [facility]        Musculoskeletal: Strength & Muscle Tone: within normal limits Gait & Station: normal Patient leans: N/A  Psychiatric Specialty Exam: Physical Exam  Psychiatric: His speech is normal and behavior is normal. Judgment and thought content normal. His affect is angry. Cognition and memory are normal.    Review of Systems  Constitutional: Negative.   HENT: Negative.   Eyes: Negative.   Respiratory: Negative.   Cardiovascular: Negative.   Gastrointestinal: Negative.   Genitourinary: Negative.   Musculoskeletal: Negative.   Skin: Negative.   Neurological: Negative.   Endo/Heme/Allergies: Negative.    Psychiatric/Behavioral: Negative.     Blood pressure (!) 161/99, pulse 70, temperature 98.2 F (36.8 C), temperature source Oral, resp. rate 15, height 5' 7"  (1.702 m), weight 65.2 kg (143 lb 11.2 oz), SpO2 100 %.Body mass index is 22.51 kg/m.  General Appearance: Casual  Eye Contact:  Good  Speech:  Clear and Coherent  Volume:  Normal  Mood:  Irritable  Affect:  Appropriate  Thought Process:  Coherent and Descriptions of Associations: Intact  Orientation:  Full (Time, Place, and Person)  Thought Content:  Logical  Suicidal Thoughts:  No  Homicidal Thoughts:  No  Memory:  Immediate;   Good Recent;   Good Remote;   Good  Judgement:  Fair  Insight:  Shallow  Psychomotor Activity:  Normal  Concentration:  Concentration: Good and Attention Span: Good  Recall:  Good  Fund of Knowledge:  Good  Language:  Good  Akathisia:  No  Handed:  Right  AIMS (if indicated):     Assets:  Communication Skills Desire for Improvement  ADL's:  Intact  Cognition:  WNL  Sleep:        Treatment Plan Summary: Plan Continue 1:1 sitter for safety  Clonidine 0.1 mg every 8 hrs as needed from Opioid withdrawal symptoms Unit social worker to refer patient to Alcohol and drug services(ADS) in [place] upon discharge  Disposition:  No evidence of imminent risk to self or others at present.   Patient does not meet criteria for psychiatric inpatient admission. Will benefit from 24 hrs observation at the hospital, may be discharged tomorrow if medically cleared.  Corena Pilgrim, MD 12/17/2016 1:25 PM

## 2016-12-18 DIAGNOSIS — T401X1A Poisoning by heroin, accidental (unintentional), initial encounter: Principal | ICD-10-CM

## 2016-12-18 DIAGNOSIS — E872 Acidosis: Secondary | ICD-10-CM

## 2016-12-18 DIAGNOSIS — T68XXXA Hypothermia, initial encounter: Secondary | ICD-10-CM

## 2016-12-18 LAB — CBC WITH DIFFERENTIAL/PLATELET
BASOS PCT: 0 %
Basophils Absolute: 0 10*3/uL (ref 0.0–0.1)
EOS ABS: 0.1 10*3/uL (ref 0.0–0.7)
Eosinophils Relative: 2 %
HCT: 33.1 % — ABNORMAL LOW (ref 39.0–52.0)
HEMOGLOBIN: 11.4 g/dL — AB (ref 13.0–17.0)
Lymphocytes Relative: 26 %
Lymphs Abs: 1.9 10*3/uL (ref 0.7–4.0)
MCH: 25.4 pg — ABNORMAL LOW (ref 26.0–34.0)
MCHC: 34.4 g/dL (ref 30.0–36.0)
MCV: 73.7 fL — ABNORMAL LOW (ref 78.0–100.0)
Monocytes Absolute: 0.7 10*3/uL (ref 0.1–1.0)
Monocytes Relative: 9 %
NEUTROS PCT: 63 %
Neutro Abs: 4.7 10*3/uL (ref 1.7–7.7)
Platelets: 203 10*3/uL (ref 150–400)
RBC: 4.49 MIL/uL (ref 4.22–5.81)
RDW: 17.2 % — ABNORMAL HIGH (ref 11.5–15.5)
WBC: 7.4 10*3/uL (ref 4.0–10.5)

## 2016-12-18 LAB — COMPREHENSIVE METABOLIC PANEL
ALBUMIN: 2.9 g/dL — AB (ref 3.5–5.0)
ALK PHOS: 62 U/L (ref 38–126)
ALT: 36 U/L (ref 17–63)
ANION GAP: 7 (ref 5–15)
AST: 32 U/L (ref 15–41)
BUN: 13 mg/dL (ref 6–20)
CALCIUM: 8.6 mg/dL — AB (ref 8.9–10.3)
CHLORIDE: 111 mmol/L (ref 101–111)
CO2: 24 mmol/L (ref 22–32)
Creatinine, Ser: 1.64 mg/dL — ABNORMAL HIGH (ref 0.61–1.24)
GFR calc Af Amer: 50 mL/min — ABNORMAL LOW (ref >60)
GFR calc non Af Amer: 43 mL/min — ABNORMAL LOW (ref >60)
GLUCOSE: 103 mg/dL — AB (ref 65–99)
Potassium: 4.2 mmol/L (ref 3.5–5.1)
SODIUM: 142 mmol/L (ref 135–145)
Total Bilirubin: 0.7 mg/dL (ref 0.3–1.2)
Total Protein: 5.8 g/dL — ABNORMAL LOW (ref 6.5–8.1)

## 2016-12-18 LAB — URINE CULTURE: CULTURE: NO GROWTH

## 2016-12-18 LAB — GLUCOSE, CAPILLARY
Glucose-Capillary: 110 mg/dL — ABNORMAL HIGH (ref 65–99)
Glucose-Capillary: 84 mg/dL (ref 65–99)

## 2016-12-18 LAB — HEMOGLOBIN A1C
HEMOGLOBIN A1C: 6.1 % — AB (ref 4.8–5.6)
MEAN PLASMA GLUCOSE: 128 mg/dL

## 2016-12-18 MED ORDER — AMLODIPINE BESYLATE 5 MG PO TABS: 5.0000 mg | ORAL_TABLET | Freq: Every day | ORAL | 0 refills | Status: DC

## 2016-12-18 MED ORDER — AMLODIPINE BESYLATE 5 MG PO TABS
5.0000 mg | ORAL_TABLET | Freq: Every day | ORAL | Status: DC
  Administered 2016-12-18: 5 mg via ORAL
  Filled 2016-12-18: qty 1

## 2016-12-18 NOTE — Progress Notes (Signed)
[facility], the caller stated pt was cleared to go home.

## 2016-12-18 NOTE — Progress Notes (Signed)
LCSW and completed referral per Psych MD recommendations for ADS. All information placed on patient's AVS regarding ADS and treatment.  There are no other needs at this time. Patient is not recommended for inpatient acute care.  Patient to follow up in the outpatient. RN called and notified.  Deretha EmoryHannah Zoelle Markus LCSW, MSW Clinical Social Work: Optician, dispensingystem [employer] Coverage for :  [phone]

## 2016-12-18 NOTE — Discharge Summary (Addendum)
Physician Discharge Summary  Dominic Molina:[id number] DOB: 1955/12/16 DOA: 12/17/2016  PCP: No PCP Per Patient  Admit date: 12/17/2016 Discharge date: 12/18/2016  Admitted From:home Disposition:home  Recommendations for Outpatient Follow-up:  1. Follow up with PCP in 1-2 weeks 2. Please obtain BMP/CBC in one week 3. Please follow up with Psychiatrist in 1-2 weeks   Home Health: No Equipment/Devices: No Discharge Condition: Stable CODE STATUS: Full code Diet recommendation: Heart healthy  Brief/Interim Summary: 61 y.o.malewith medical history significant of polysubstance abuse, tobacco use disorder who was brought to the emergency department via EMS after he was found by family member slumped over a chair. Patient received Narcan by EMS. He was found to have hypothermia on admission.  Patient was observed in the hospital for possible heroin overdose. His mental status significantly improved. He was evaluated by psychiatrist who recommended outpatient follow-up and stable to discharge. Patient was treated with IV antibiotics on admission for presumed sepsis which is ruled out on discharge. Leukocytosis and elevated lactate level likely in the setting of stress. Leukocytosis improved. Patient has no fever. He is clinically stable. Urine culture negative. Blood cultures negative so far. He denies headache, dizziness, fever, chills, nausea, vomiting, chest pain or shortness of breath. He is very pleasant today. Denied suicidal or homicidal ideation.  Patient also with acute kidney injury on chronic kidney disease is stage III: Serum creatinine level baseline on discharge. Patient was educated to follow up with PCP and psychiatrist as an outpatient. Also recommended to monitor labs. Patient was educated regarding stopping polysubstance abuse and also educated for smoking cessation. He verbalized understanding.  During hospitalization patient was found to have elevated blood pressure. He  was started on low-dose Norvasc. Patient was advised to monitor blood pressure home and follow up with PCP. May need adjustment of the medication depending on follow-up blood pressure. Patient is asymptomatic and    Discharge Diagnoses:  Principal Problem:   Heroin overdose Active Problems:   Hyperkalemia   Polysubstance abuse   AKI (acute kidney injury) on chronic kidney disease stage III   Type 2 diabetes mellitus ([facility])   Presumed Sepsis due to undetermined organism on admission,  ruled out    Hypothermia   Lactic acidosis    Discharge Instructions  Discharge Instructions    Call MD for:  difficulty breathing, headache or visual disturbances    Complete by:  As directed    Call MD for:  extreme fatigue    Complete by:  As directed    Call MD for:  hives    Complete by:  As directed    Call MD for:  persistant dizziness or light-headedness    Complete by:  As directed    Call MD for:  persistant nausea and vomiting    Complete by:  As directed    Call MD for:  severe uncontrolled pain    Complete by:  As directed    Call MD for:  temperature >100.4    Complete by:  As directed    Diet - low sodium heart healthy    Complete by:  As directed    Discharge instructions    Complete by:  As directed    Please follow up with PCP and psychiatrist in 1-2 weeks.  Please monitor BP at home.   Increase activity slowly    Complete by:  As directed      Allergies as of 12/18/2016   No Known Allergies     Medication  List    TAKE these medications   amLODipine 5 MG tablet Commonly known as:  NORVASC Take 1 tablet (5 mg total) by mouth daily. Start taking on:  12/19/2016      Follow-up Information    [facility]. Schedule an appointment as soon as possible for a visit in 1 week(s).   Contact information: [address] [place] [address] [phone]       [facility]. Schedule an appointment as soon  as possible for a visit on 12/19/2016.   Specialty:  Behavioral Health Why:  Follow up with outpatient provider  for continued treatment.  [facility] Outpatient Counseling Opioid Treatment Program You can call or walk in.  Opens: 6am-5pm Contact information: [address] [place] [address] [phone]          No Known Allergies  Consultations: Psychiatrist  Procedures/Studies: None  Subjective: Patient was seen and examined at bedside. Patient reported feeling much better and eager to go home today. He denied fever, chills, headache, dizziness, nausea, vomiting, chest pain, shortness of breath, dysuria, urgency, diarrhea or constipation. He is able to tolerate diet well. He verbalized understanding of follow-up instruction. Denied suicidal or homicidal ideation. Denied low mood or depression.  Discharge Exam: Vitals:   12/18/16 1209 12/18/16 1426  BP: (!) 145/98 (!) 175/94  Pulse:  64  Resp:  18  Temp:  98.2 F (36.8 C)   Vitals:   12/18/16 0633 12/18/16 1208 12/18/16 1209 12/18/16 1426  BP: (!) 181/110 (!) 160/112 (!) 145/98 (!) 175/94  Pulse: 83   64  Resp: 18   18  Temp: 98.9 F (37.2 C)   98.2 F (36.8 C)  TempSrc: Oral   Oral  SpO2: 100%   100%  Weight:      Height:        General: Pt is alert, awake, not in acute distress Cardiovascular: RRR, S1/S2 +, no rubs, no gallops Respiratory: CTA bilaterally, no wheezing, no rhonchi Abdominal: Soft, NT, ND, bowel sounds + Extremities: no edema, no cyanosis Neurology: Alert, awake, following commands, muscle strength 5 over 5 in all extremities symmetric, oriented 3.   The results of significant diagnostics from this hospitalization (including imaging, microbiology, ancillary and laboratory) are listed below for reference.     Microbiology: Recent Results (from the past 240 hour(s))  Blood culture (routine x 2)     Status: None (Preliminary result)    Collection Time: 12/17/16  2:09 AM  Result Value Ref Range Status   Specimen Description BLOOD LEFT ARM  Final   Special Requests BOTTLES DRAWN AEROBIC AND ANAEROBIC  Final   Culture NO GROWTH 1 DAY  Final   Report Status PENDING  Incomplete  Blood culture (routine x 2)     Status: None (Preliminary result)   Collection Time: 12/17/16  2:18 AM  Result Value Ref Range Status   Specimen Description BLOOD LEFT HAND  Final   Special Requests IN PEDIATRIC BOTTLE  Final   Culture NO GROWTH 1 DAY  Final   Report Status PENDING  Incomplete  Urine culture     Status: None   Collection Time: 12/17/16  3:20 AM  Result Value Ref Range Status   Specimen Description URINE, RANDOM  Final   Special Requests NONE  Final   Culture NO GROWTH  Final   Report Status 12/18/2016 FINAL  Final  MRSA PCR  Screening     Status: None   Collection Time: 12/17/16  8:31 AM  Result Value Ref Range Status   MRSA by PCR NEGATIVE NEGATIVE Final    Comment:        The GeneXpert MRSA Assay (FDA approved for NASAL specimens only), is one component of a comprehensive MRSA colonization surveillance program. It is not intended to diagnose MRSA infection nor to guide or monitor treatment for MRSA infections.      Labs: BNP (last 3 results) No results for input(s): BNP in the last 8760 hours. Basic Metabolic Panel:  Recent Labs Lab 12/17/16 0209 12/17/16 0952 12/18/16 0553  NA 141 142 142  K 5.5* 3.8 4.2  CL 107 110 111  CO2 22 25 24   GLUCOSE 199* 85 103*  BUN 21* 20 13  CREATININE 2.15* 1.76* 1.64*  CALCIUM 9.0 8.0* 8.6*   Liver Function Tests:  Recent Labs Lab 12/17/16 0209 12/18/16 0553  AST 95* 32  ALT 61 36  ALKPHOS 88 62  BILITOT 0.6 0.7  PROT 7.1 5.8*  ALBUMIN 3.6 2.9*   No results for input(s): LIPASE, AMYLASE in the last 168 hours. No results for input(s): AMMONIA in the last 168 hours. CBC:  Recent Labs Lab 12/17/16 0209 12/18/16 0553  WBC 19.5* 7.4  NEUTROABS  17.1* 4.7  HGB 13.8 11.4*  HCT 39.1 33.1*  MCV 75.2* 73.7*  PLT 223 203   Cardiac Enzymes: No results for input(s): CKTOTAL, CKMB, CKMBINDEX, TROPONINI in the last 168 hours. BNP: Invalid input(s): POCBNP CBG:  Recent Labs Lab 12/17/16 1221 12/17/16 1759 12/17/16 2345 12/18/16 0818 12/18/16 1314  GLUCAP 73 81 114* 110* 84   D-Dimer No results for input(s): DDIMER in the last 72 hours. Hgb A1c  Recent Labs  12/17/16 0800  HGBA1C 6.1*   Lipid Profile No results for input(s): CHOL, HDL, LDLCALC, TRIG, CHOLHDL, LDLDIRECT in the last 72 hours. Thyroid function studies No results for input(s): TSH, T4TOTAL, T3FREE, THYROIDAB in the last 72 hours.  Invalid input(s): FREET3 Anemia work up No results for input(s): VITAMINB12, FOLATE, FERRITIN, TIBC, IRON, RETICCTPCT in the last 72 hours. Urinalysis    Component Value Date/Time   COLORURINE YELLOW 12/17/2016 0320   APPEARANCEUR HAZY (A) 12/17/2016 0320   LABSPEC 1.015 12/17/2016 0320   PHURINE 5.0 12/17/2016 0320   GLUCOSEU 50 (A) 12/17/2016 0320   HGBUR MODERATE (A) 12/17/2016 0320   BILIRUBINUR NEGATIVE 12/17/2016 0320   KETONESUR NEGATIVE 12/17/2016 0320   PROTEINUR 30 (A) 12/17/2016 0320   NITRITE NEGATIVE 12/17/2016 0320   LEUKOCYTESUR NEGATIVE 12/17/2016 0320   Sepsis Labs Invalid input(s): PROCALCITONIN,  WBC,  LACTICIDVEN Microbiology Recent Results (from the past 240 hour(s))  Blood culture (routine x 2)     Status: None (Preliminary result)   Collection Time: 12/17/16  2:09 AM  Result Value Ref Range Status   Specimen Description BLOOD LEFT ARM  Final   Special Requests BOTTLES DRAWN AEROBIC AND ANAEROBIC 5ML  Final   Culture NO GROWTH 1 DAY  Final   Report Status PENDING  Incomplete  Blood culture (routine x 2)     Status: None (Preliminary result)   Collection Time: 12/17/16  2:18 AM  Result Value Ref Range Status   Specimen Description BLOOD LEFT HAND  Final   Special Requests IN PEDIATRIC BOTTLE  2ML  Final   Culture NO GROWTH 1 DAY  Final   Report Status PENDING  Incomplete  Urine culture  Status: None   Collection Time: 12/17/16  3:20 AM  Result Value Ref Range Status   Specimen Description URINE, RANDOM  Final   Special Requests NONE  Final   Culture NO GROWTH  Final   Report Status 12/18/2016 FINAL  Final  MRSA PCR Screening     Status: None   Collection Time: 12/17/16  8:31 AM  Result Value Ref Range Status   MRSA by PCR NEGATIVE NEGATIVE Final    Comment:        The GeneXpert MRSA Assay (FDA approved for NASAL specimens only), is one component of a comprehensive MRSA colonization surveillance program. It is not intended to diagnose MRSA infection nor to guide or monitor treatment for MRSA infections.      Time coordinating discharge: 26 minutes  SIGNED:   Maxie Barb, MD  [facility] 12/18/2016, 5:09 PM  If 7PM-7AM, please contact night-coverage [email or web address] Password [email or web address]

## 2016-12-18 NOTE — Progress Notes (Signed)
Dominic BambergBenjamin A Molina to be D/C'd Home per MD order.  Discussed with the patient and all questions fully answered.  VSS, Skin clean, dry and intact without evidence of skin break down, no evidence of skin tears noted. IV catheter discontinued intact. Site without signs and symptoms of complications. Dressing and pressure applied.  An After Visit Summary was printed and given to the patient. Patient received prescription.  D/c education completed with patient/family including follow up instructions, medication list, d/c activities limitations if indicated, with other d/c instructions as indicated by MD - patient able to verbalize understanding, all questions fully answered.   Patient instructed to return to ED, call 911, or call MD for any changes in condition.   Patient escorted via WC, and D/C home via private auto.  Dominic Molina 12/18/2016 5:09 PM

## 2016-12-18 NOTE — Progress Notes (Signed)
Called Child psychotherapistsocial worker and left a message. Waiting for response.

## 2016-12-19 LAB — HIV ANTIBODY (ROUTINE TESTING W REFLEX): HIV Screen 4th Generation wRfx: NONREACTIVE

## 2016-12-22 LAB — CULTURE, BLOOD (ROUTINE X 2)
CULTURE: NO GROWTH
Culture: NO GROWTH

## 2021-12-20 DIAGNOSIS — Z789 Other specified health status: Secondary | ICD-10-CM | POA: Diagnosis not present

## 2021-12-20 DIAGNOSIS — I10 Essential (primary) hypertension: Secondary | ICD-10-CM | POA: Diagnosis not present

## 2021-12-20 DIAGNOSIS — F17210 Nicotine dependence, cigarettes, uncomplicated: Secondary | ICD-10-CM | POA: Diagnosis not present

## 2021-12-20 DIAGNOSIS — E1165 Type 2 diabetes mellitus with hyperglycemia: Secondary | ICD-10-CM | POA: Diagnosis not present

## 2021-12-20 DIAGNOSIS — E559 Vitamin D deficiency, unspecified: Secondary | ICD-10-CM | POA: Diagnosis not present

## 2021-12-20 DIAGNOSIS — Z79899 Other long term (current) drug therapy: Secondary | ICD-10-CM | POA: Diagnosis not present

## 2021-12-20 DIAGNOSIS — Z0000 Encounter for general adult medical examination without abnormal findings: Secondary | ICD-10-CM | POA: Diagnosis not present

## 2021-12-20 DIAGNOSIS — Z7984 Long term (current) use of oral hypoglycemic drugs: Secondary | ICD-10-CM | POA: Diagnosis not present

## 2022-02-02 ENCOUNTER — Other Ambulatory Visit: Payer: Self-pay

## 2022-02-02 ENCOUNTER — Emergency Department (HOSPITAL_COMMUNITY)
Admission: EM | Admit: 2022-02-02 | Discharge: 2022-02-02 | Discharge status: Against Medical Advice | Payer: Medicare Other | Attending: Emergency Medicine | Admitting: Emergency Medicine

## 2022-02-02 ENCOUNTER — Encounter (HOSPITAL_COMMUNITY): Payer: Self-pay | Admitting: Emergency Medicine

## 2022-02-02 ENCOUNTER — Emergency Department (HOSPITAL_COMMUNITY): Payer: Medicare Other

## 2022-02-02 DIAGNOSIS — R4182 Altered mental status, unspecified: Secondary | ICD-10-CM | POA: Insufficient documentation

## 2022-02-02 DIAGNOSIS — Z5321 Procedure and treatment not carried out due to patient leaving prior to being seen by health care provider: Secondary | ICD-10-CM | POA: Insufficient documentation

## 2022-02-02 DIAGNOSIS — J9811 Atelectasis: Secondary | ICD-10-CM | POA: Insufficient documentation

## 2022-02-02 DIAGNOSIS — F1490 Cocaine use, unspecified, uncomplicated: Secondary | ICD-10-CM | POA: Insufficient documentation

## 2022-02-02 DIAGNOSIS — R61 Generalized hyperhidrosis: Secondary | ICD-10-CM | POA: Insufficient documentation

## 2022-02-02 DIAGNOSIS — R410 Disorientation, unspecified: Secondary | ICD-10-CM | POA: Insufficient documentation

## 2022-02-02 LAB — CBC WITH DIFFERENTIAL/PLATELET
Abs Immature Granulocytes: 0.06 10*3/uL (ref 0.00–0.07)
Basophils Absolute: 0 10*3/uL (ref 0.0–0.1)
Basophils Relative: 0 %
Eosinophils Absolute: 0 10*3/uL (ref 0.0–0.5)
Eosinophils Relative: 0 %
HCT: 44.3 % (ref 39.0–52.0)
Hemoglobin: 15.6 g/dL (ref 13.0–17.0)
Immature Granulocytes: 0 %
Lymphocytes Relative: 6 %
Lymphs Abs: 0.9 10*3/uL (ref 0.7–4.0)
MCH: 26.4 pg (ref 26.0–34.0)
MCHC: 35.2 g/dL (ref 30.0–36.0)
MCV: 75 fL — ABNORMAL LOW (ref 80.0–100.0)
Monocytes Absolute: 0.9 10*3/uL (ref 0.1–1.0)
Monocytes Relative: 6 %
Neutro Abs: 13.9 10*3/uL — ABNORMAL HIGH (ref 1.7–7.7)
Neutrophils Relative %: 88 %
Platelets: 268 10*3/uL (ref 150–400)
RBC: 5.91 MIL/uL — ABNORMAL HIGH (ref 4.22–5.81)
RDW: 16.4 % — ABNORMAL HIGH (ref 11.5–15.5)
WBC: 15.9 10*3/uL — ABNORMAL HIGH (ref 4.0–10.5)
nRBC: 0 % (ref 0.0–0.2)

## 2022-02-02 LAB — COMPREHENSIVE METABOLIC PANEL
ALT: 16 U/L (ref 0–44)
AST: 20 U/L (ref 15–41)
Albumin: 4 g/dL (ref 3.5–5.0)
Alkaline Phosphatase: 83 U/L (ref 38–126)
Anion gap: 12 (ref 5–15)
BUN: 43 mg/dL — ABNORMAL HIGH (ref 8–23)
CO2: 23 mmol/L (ref 22–32)
Calcium: 9.4 mg/dL (ref 8.9–10.3)
Chloride: 102 mmol/L (ref 98–111)
Creatinine, Ser: 3.17 mg/dL — ABNORMAL HIGH (ref 0.61–1.24)
GFR, Estimated: 21 mL/min — ABNORMAL LOW (ref >60)
Glucose, Bld: 340 mg/dL — ABNORMAL HIGH (ref 70–99)
Potassium: 4.2 mmol/L (ref 3.5–5.1)
Sodium: 137 mmol/L (ref 135–145)
Total Bilirubin: 0.5 mg/dL (ref 0.3–1.2)
Total Protein: 7.6 g/dL (ref 6.5–8.1)

## 2022-02-02 LAB — PROTIME-INR
INR: 1 (ref 0.8–1.2)
Prothrombin Time: 13 seconds (ref 11.4–15.2)

## 2022-02-02 LAB — CBG MONITORING, ED: Glucose-Capillary: 314 mg/dL — ABNORMAL HIGH (ref 70–99)

## 2022-02-02 LAB — ETHANOL: Alcohol, Ethyl (B): <10 mg/dL (ref <10)

## 2022-02-02 LAB — LACTIC ACID, PLASMA: Lactic Acid, Venous: 1.9 mmol/L (ref 0.5–1.9)

## 2022-02-02 LAB — TROPONIN I (HIGH SENSITIVITY)
Troponin I (High Sensitivity): 12 ng/L (ref <18)
Troponin I (High Sensitivity): 12 ng/L (ref <18)

## 2022-02-02 NOTE — ED Triage Notes (Signed)
Patient from home, patient was found unconscious and diaphoretic in bathroom by sister.  Patient does have history of crack cocaine use.  Patient is now CAOx 4, GCS of 15.  No chest pain, shortness of breath, nausea or vomiting.   ?

## 2022-02-02 NOTE — ED Provider Triage Note (Signed)
Emergency Medicine Provider Triage Evaluation Note ? ?Dominic Molina , a 66 y.o. male  was evaluated in triage.  Pt presents via EMS with concern for being found Diaphoretic and unconscious in the bathroom earlier this evening by his sister.  Patient does endorse cocaine use and states he did a line of heroin this evening.  He denies having been confused afterwards though EMS states that he was initially with decreased GCS upon their arrival, however upon presentation to the ED is improved to 15.  He denies any chest pain, shortness of breath, or any other symptoms at this time. ? ?Review of Systems  ?Positive: Altered mental status, drug use ?Negative: Chest pain or shortness of breath palp ? ?Physical Exam  ?BP (!) 140/98 (BP Location: Right Arm)   Pulse (!) 101   Temp 97.7 ?F (36.5 ?C) (Oral)   Resp 16   SpO2 96%  ?Gen:   Awake, no distress   ?Resp:  Normal effort  ?MSK:   Moves extremities without difficulty  ?Other:  Pinpoint pupils.  EOMI.  Face is symmetric without facial droop or slurred speech.  Patient moving all 4 extremities spontaneously and without difficulty, able to stand on his own though somewhat unsteady.  Symmetric strength and sensation in upper and lower extremities bilaterally.  GCS of 15 at this time. ? ?Medical Decision Making  ?Medically screening exam initiated at 2:50 AM.  Appropriate orders placed.  Dominic Molina was informed that the remainder of the evaluation will be completed by another provider, this initial triage assessment does not replace that evaluation, and the importance of remaining in the ED until their evaluation is complete. ? ?Suspect acute opiate overdose prior to arrival in the emergency department; we will labs for work-up for AMS given concerning presentation of diaphoresis and questionable chest pain in route with EMS. ? ?This chart was dictated using voice recognition software, Dragon. Despite the best efforts of this provider to proofread and correct  errors, errors may still occur which can change documentation meaning. ? ?  ?Paris Lore, PA-C ?02/02/22 0257 ? ?

## 2022-02-02 NOTE — ED Notes (Signed)
Patient states he wanted toleave. Patient escorted to triage by phlebotomy to have IV removed.  ?

## 2022-10-04 ENCOUNTER — Other Ambulatory Visit: Payer: Self-pay | Admitting: Registered Nurse

## 2022-10-04 DIAGNOSIS — R011 Cardiac murmur, unspecified: Secondary | ICD-10-CM

## 2022-10-04 DIAGNOSIS — I10 Essential (primary) hypertension: Secondary | ICD-10-CM

## 2022-10-14 ENCOUNTER — Other Ambulatory Visit: Payer: Self-pay | Admitting: Registered Nurse

## 2022-10-14 ENCOUNTER — Ambulatory Visit
Admission: RE | Admit: 2022-10-14 | Discharge: 2022-10-14 | Disposition: A | Discharge status: Home / Self Care | Payer: Medicare Other | Source: Ambulatory Visit | Attending: Registered Nurse | Admitting: Registered Nurse

## 2022-10-14 DIAGNOSIS — R011 Cardiac murmur, unspecified: Secondary | ICD-10-CM

## 2022-10-14 DIAGNOSIS — I10 Essential (primary) hypertension: Secondary | ICD-10-CM

## 2023-01-13 ENCOUNTER — Other Ambulatory Visit: Payer: Self-pay

## 2023-01-13 ENCOUNTER — Inpatient Hospital Stay (HOSPITAL_COMMUNITY)
Admission: EM | Admit: 2023-01-13 | Discharge: 2023-01-17 | DRG: 637 | Disposition: A | Discharge status: Home / Self Care | Payer: 59 | Attending: Internal Medicine | Admitting: Internal Medicine

## 2023-01-13 ENCOUNTER — Encounter (HOSPITAL_COMMUNITY): Payer: Self-pay

## 2023-01-13 DIAGNOSIS — F1910 Other psychoactive substance abuse, uncomplicated: Secondary | ICD-10-CM | POA: Diagnosis present

## 2023-01-13 DIAGNOSIS — R824 Acetonuria: Secondary | ICD-10-CM | POA: Diagnosis present

## 2023-01-13 DIAGNOSIS — F17200 Nicotine dependence, unspecified, uncomplicated: Secondary | ICD-10-CM | POA: Diagnosis present

## 2023-01-13 DIAGNOSIS — H538 Other visual disturbances: Secondary | ICD-10-CM | POA: Diagnosis present

## 2023-01-13 DIAGNOSIS — Z8249 Family history of ischemic heart disease and other diseases of the circulatory system: Secondary | ICD-10-CM

## 2023-01-13 DIAGNOSIS — R42 Dizziness and giddiness: Secondary | ICD-10-CM | POA: Diagnosis not present

## 2023-01-13 DIAGNOSIS — R634 Abnormal weight loss: Secondary | ICD-10-CM | POA: Diagnosis present

## 2023-01-13 DIAGNOSIS — B370 Candidal stomatitis: Secondary | ICD-10-CM | POA: Diagnosis present

## 2023-01-13 DIAGNOSIS — Z7984 Long term (current) use of oral hypoglycemic drugs: Secondary | ICD-10-CM

## 2023-01-13 DIAGNOSIS — Z681 Body mass index (BMI) 19 or less, adult: Secondary | ICD-10-CM

## 2023-01-13 DIAGNOSIS — E1122 Type 2 diabetes mellitus with diabetic chronic kidney disease: Secondary | ICD-10-CM | POA: Diagnosis present

## 2023-01-13 DIAGNOSIS — N179 Acute kidney failure, unspecified: Secondary | ICD-10-CM | POA: Diagnosis present

## 2023-01-13 DIAGNOSIS — E785 Hyperlipidemia, unspecified: Secondary | ICD-10-CM | POA: Diagnosis present

## 2023-01-13 DIAGNOSIS — Z720 Tobacco use: Secondary | ICD-10-CM | POA: Diagnosis present

## 2023-01-13 DIAGNOSIS — E1100 Type 2 diabetes mellitus with hyperosmolarity without nonketotic hyperglycemic-hyperosmolar coma (NKHHC): Secondary | ICD-10-CM | POA: Diagnosis not present

## 2023-01-13 DIAGNOSIS — E875 Hyperkalemia: Secondary | ICD-10-CM | POA: Diagnosis present

## 2023-01-13 DIAGNOSIS — E43 Unspecified severe protein-calorie malnutrition: Secondary | ICD-10-CM | POA: Insufficient documentation

## 2023-01-13 DIAGNOSIS — E871 Hypo-osmolality and hyponatremia: Secondary | ICD-10-CM | POA: Diagnosis present

## 2023-01-13 DIAGNOSIS — R739 Hyperglycemia, unspecified: Secondary | ICD-10-CM | POA: Diagnosis present

## 2023-01-13 DIAGNOSIS — F1410 Cocaine abuse, uncomplicated: Secondary | ICD-10-CM | POA: Diagnosis present

## 2023-01-13 DIAGNOSIS — Z91148 Patient's other noncompliance with medication regimen for other reason: Secondary | ICD-10-CM

## 2023-01-13 DIAGNOSIS — R81 Glycosuria: Secondary | ICD-10-CM | POA: Diagnosis present

## 2023-01-13 DIAGNOSIS — E876 Hypokalemia: Secondary | ICD-10-CM | POA: Diagnosis not present

## 2023-01-13 DIAGNOSIS — Z79899 Other long term (current) drug therapy: Secondary | ICD-10-CM

## 2023-01-13 DIAGNOSIS — E860 Dehydration: Secondary | ICD-10-CM | POA: Diagnosis present

## 2023-01-13 DIAGNOSIS — E870 Hyperosmolality and hypernatremia: Secondary | ICD-10-CM | POA: Diagnosis present

## 2023-01-13 DIAGNOSIS — N1832 Chronic kidney disease, stage 3b: Secondary | ICD-10-CM | POA: Diagnosis present

## 2023-01-13 DIAGNOSIS — I129 Hypertensive chronic kidney disease with stage 1 through stage 4 chronic kidney disease, or unspecified chronic kidney disease: Secondary | ICD-10-CM | POA: Diagnosis present

## 2023-01-13 LAB — CBC WITH DIFFERENTIAL/PLATELET
Abs Immature Granulocytes: 0.03 10*3/uL (ref 0.00–0.07)
Basophils Absolute: 0 10*3/uL (ref 0.0–0.1)
Basophils Relative: 0 %
Eosinophils Absolute: 0 10*3/uL (ref 0.0–0.5)
Eosinophils Relative: 0 %
HCT: 48.9 % (ref 39.0–52.0)
Hemoglobin: 16.8 g/dL (ref 13.0–17.0)
Immature Granulocytes: 0 %
Lymphocytes Relative: 6 %
Lymphs Abs: 0.6 10*3/uL — ABNORMAL LOW (ref 0.7–4.0)
MCH: 25.4 pg — ABNORMAL LOW (ref 26.0–34.0)
MCHC: 34.4 g/dL (ref 30.0–36.0)
MCV: 74 fL — ABNORMAL LOW (ref 80.0–100.0)
Monocytes Absolute: 0.4 10*3/uL (ref 0.1–1.0)
Monocytes Relative: 4 %
Neutro Abs: 8.1 10*3/uL — ABNORMAL HIGH (ref 1.7–7.7)
Neutrophils Relative %: 90 %
Platelets: 213 10*3/uL (ref 150–400)
RBC: 6.61 MIL/uL — ABNORMAL HIGH (ref 4.22–5.81)
RDW: 17.3 % — ABNORMAL HIGH (ref 11.5–15.5)
WBC: 9.1 10*3/uL (ref 4.0–10.5)
nRBC: 0 % (ref 0.0–0.2)

## 2023-01-13 LAB — GLUCOSE, CAPILLARY
Glucose-Capillary: 152 mg/dL — ABNORMAL HIGH (ref 70–99)
Glucose-Capillary: 155 mg/dL — ABNORMAL HIGH (ref 70–99)
Glucose-Capillary: 202 mg/dL — ABNORMAL HIGH (ref 70–99)
Glucose-Capillary: 218 mg/dL — ABNORMAL HIGH (ref 70–99)
Glucose-Capillary: 312 mg/dL — ABNORMAL HIGH (ref 70–99)
Glucose-Capillary: 478 mg/dL — ABNORMAL HIGH (ref 70–99)
Glucose-Capillary: 521 mg/dL (ref 70–99)
Glucose-Capillary: >600 mg/dL (ref 70–99)
Glucose-Capillary: >600 mg/dL (ref 70–99)
Glucose-Capillary: >600 mg/dL (ref 70–99)
Glucose-Capillary: >600 mg/dL (ref 70–99)

## 2023-01-13 LAB — URINALYSIS, W/ REFLEX TO CULTURE (INFECTION SUSPECTED)
Bacteria, UA: NONE SEEN
Bilirubin Urine: NEGATIVE
Glucose, UA: >=500 mg/dL — AB
Ketones, ur: 5 mg/dL — AB
Leukocytes,Ua: NEGATIVE
Nitrite: NEGATIVE
Protein, ur: NEGATIVE mg/dL
Specific Gravity, Urine: 1.022 (ref 1.005–1.030)
pH: 5 (ref 5.0–8.0)

## 2023-01-13 LAB — BASIC METABOLIC PANEL
Anion gap: 14 (ref 5–15)
Anion gap: 14 (ref 5–15)
Anion gap: 12 (ref 5–15)
BUN: 47 mg/dL — ABNORMAL HIGH (ref 8–23)
BUN: 44 mg/dL — ABNORMAL HIGH (ref 8–23)
BUN: 43 mg/dL — ABNORMAL HIGH (ref 8–23)
CO2: 20 mmol/L — ABNORMAL LOW (ref 22–32)
CO2: 21 mmol/L — ABNORMAL LOW (ref 22–32)
CO2: 22 mmol/L (ref 22–32)
Calcium: 8.7 mg/dL — ABNORMAL LOW (ref 8.9–10.3)
Calcium: 9 mg/dL (ref 8.9–10.3)
Calcium: 8.9 mg/dL (ref 8.9–10.3)
Chloride: 91 mmol/L — ABNORMAL LOW (ref 98–111)
Chloride: 94 mmol/L — ABNORMAL LOW (ref 98–111)
Chloride: 98 mmol/L (ref 98–111)
Creatinine, Ser: 2.93 mg/dL — ABNORMAL HIGH (ref 0.61–1.24)
Creatinine, Ser: 2.7 mg/dL — ABNORMAL HIGH (ref 0.61–1.24)
Creatinine, Ser: 2.56 mg/dL — ABNORMAL HIGH (ref 0.61–1.24)
GFR, Estimated: 23 mL/min — ABNORMAL LOW (ref >60)
GFR, Estimated: 25 mL/min — ABNORMAL LOW (ref >60)
GFR, Estimated: 27 mL/min — ABNORMAL LOW (ref >60)
Glucose, Bld: 749 mg/dL (ref 70–99)
Glucose, Bld: 484 mg/dL — ABNORMAL HIGH (ref 70–99)
Glucose, Bld: 173 mg/dL — ABNORMAL HIGH (ref 70–99)
Potassium: 4.4 mmol/L (ref 3.5–5.1)
Potassium: 4.4 mmol/L (ref 3.5–5.1)
Potassium: 3.6 mmol/L (ref 3.5–5.1)
Sodium: 125 mmol/L — ABNORMAL LOW (ref 135–145)
Sodium: 129 mmol/L — ABNORMAL LOW (ref 135–145)
Sodium: 132 mmol/L — ABNORMAL LOW (ref 135–145)

## 2023-01-13 LAB — URINALYSIS, ROUTINE W REFLEX MICROSCOPIC
Bacteria, UA: NONE SEEN
Bilirubin Urine: NEGATIVE
Glucose, UA: >=500 mg/dL — AB
Ketones, ur: 5 mg/dL — AB
Leukocytes,Ua: NEGATIVE
Nitrite: NEGATIVE
Protein, ur: NEGATIVE mg/dL
Specific Gravity, Urine: 1.017 (ref 1.005–1.030)
pH: 5 (ref 5.0–8.0)

## 2023-01-13 LAB — BETA-HYDROXYBUTYRIC ACID: Beta-Hydroxybutyric Acid: 1.8 mmol/L — ABNORMAL HIGH (ref 0.05–0.27)

## 2023-01-13 LAB — COMPREHENSIVE METABOLIC PANEL
ALT: 31 U/L (ref 0–44)
AST: 26 U/L (ref 15–41)
Albumin: 3.5 g/dL (ref 3.5–5.0)
Alkaline Phosphatase: 110 U/L (ref 38–126)
Anion gap: 15 (ref 5–15)
BUN: 51 mg/dL — ABNORMAL HIGH (ref 8–23)
CO2: 22 mmol/L (ref 22–32)
Calcium: 9 mg/dL (ref 8.9–10.3)
Chloride: 80 mmol/L — ABNORMAL LOW (ref 98–111)
Creatinine, Ser: 3.34 mg/dL — ABNORMAL HIGH (ref 0.61–1.24)
GFR, Estimated: 19 mL/min — ABNORMAL LOW (ref >60)
Glucose, Bld: 1117 mg/dL (ref 70–99)
Potassium: 5.5 mmol/L — ABNORMAL HIGH (ref 3.5–5.1)
Sodium: 117 mmol/L — CL (ref 135–145)
Total Bilirubin: 1 mg/dL (ref 0.3–1.2)
Total Protein: 7.5 g/dL (ref 6.5–8.1)

## 2023-01-13 LAB — BLOOD GAS, VENOUS
Acid-base deficit: 5 mmol/L — ABNORMAL HIGH (ref 0.0–2.0)
Bicarbonate: 24.1 mmol/L (ref 20.0–28.0)
O2 Saturation: 34.2 %
Patient temperature: 37
pCO2, Ven: 59 mmHg (ref 44–60)
pH, Ven: 7.22 — ABNORMAL LOW (ref 7.25–7.43)
pO2, Ven: <31 mmHg — CL (ref 32–45)

## 2023-01-13 LAB — OSMOLALITY: Osmolality: 348 mOsm/kg (ref 275–295)

## 2023-01-13 LAB — CBG MONITORING, ED: Glucose-Capillary: >600 mg/dL (ref 70–99)

## 2023-01-13 LAB — MRSA NEXT GEN BY PCR, NASAL: MRSA by PCR Next Gen: NOT DETECTED

## 2023-01-13 LAB — LIPASE, BLOOD: Lipase: 37 U/L (ref 11–51)

## 2023-01-13 MED ORDER — LIVING WELL WITH DIABETES BOOK
Freq: Once | Status: AC
  Filled 2023-01-13: qty 1

## 2023-01-13 MED ORDER — ACETAMINOPHEN 650 MG RE SUPP: 650.0000 mg | Freq: Four times a day (QID) | RECTAL | Status: DC | PRN

## 2023-01-13 MED ORDER — CHLORHEXIDINE GLUCONATE CLOTH 2 % EX PADS
6.0000 | MEDICATED_PAD | Freq: Every day | CUTANEOUS | Status: DC
  Administered 2023-01-13 – 2023-01-16 (×2): 6 via TOPICAL

## 2023-01-13 MED ORDER — SODIUM CHLORIDE 0.9 % IV BOLUS
1000.0000 mL | Freq: Once | INTRAVENOUS | Status: AC
  Administered 2023-01-13: 1000 mL via INTRAVENOUS

## 2023-01-13 MED ORDER — LACTATED RINGERS IV SOLN: INTRAVENOUS | Status: DC

## 2023-01-13 MED ORDER — INSULIN STARTER KIT- PEN NEEDLES (ENGLISH)
1.0000 | Freq: Once | Status: AC
  Administered 2023-01-13: 1
  Filled 2023-01-13: qty 1

## 2023-01-13 MED ORDER — DEXTROSE 50 % IV SOLN: 0.0000 mL | INTRAVENOUS | Status: DC | PRN

## 2023-01-13 MED ORDER — NYSTATIN 100000 UNIT/ML MT SUSP
5.0000 mL | Freq: Four times a day (QID) | OROMUCOSAL | Status: DC
  Administered 2023-01-13 – 2023-01-17 (×16): 500000 [IU] via ORAL
  Filled 2023-01-13 (×16): qty 5

## 2023-01-13 MED ORDER — INFLUENZA VAC A&B SA ADJ QUAD 0.5 ML IM PRSY
0.5000 mL | PREFILLED_SYRINGE | INTRAMUSCULAR | Status: DC
  Filled 2023-01-13: qty 0.5

## 2023-01-13 MED ORDER — PNEUMOCOCCAL 20-VAL CONJ VACC 0.5 ML IM SUSY
0.5000 mL | PREFILLED_SYRINGE | INTRAMUSCULAR | Status: DC
  Filled 2023-01-13: qty 0.5

## 2023-01-13 MED ORDER — ONDANSETRON HCL 4 MG/2ML IJ SOLN: 4.0000 mg | Freq: Four times a day (QID) | INTRAMUSCULAR | Status: DC | PRN

## 2023-01-13 MED ORDER — LACTATED RINGERS IV BOLUS
20.0000 mL/kg | Freq: Once | INTRAVENOUS | Status: AC
  Administered 2023-01-13: 1180 mL via INTRAVENOUS

## 2023-01-13 MED ORDER — DEXTROSE IN LACTATED RINGERS 5 % IV SOLN: INTRAVENOUS | Status: DC

## 2023-01-13 MED ORDER — ONDANSETRON HCL 4 MG PO TABS: 4.0000 mg | ORAL_TABLET | Freq: Four times a day (QID) | ORAL | Status: DC | PRN

## 2023-01-13 MED ORDER — ACETAMINOPHEN 325 MG PO TABS
650.0000 mg | ORAL_TABLET | Freq: Four times a day (QID) | ORAL | Status: DC | PRN
  Administered 2023-01-15 – 2023-01-16 (×2): 650 mg via ORAL
  Filled 2023-01-13 (×2): qty 2

## 2023-01-13 MED ORDER — ORAL CARE MOUTH RINSE: 15.0000 mL | OROMUCOSAL | Status: DC | PRN

## 2023-01-13 MED ORDER — INSULIN REGULAR(HUMAN) IN NACL 100-0.9 UT/100ML-% IV SOLN
INTRAVENOUS | Status: DC
  Administered 2023-01-13: 7.5 [IU]/h via INTRAVENOUS
  Filled 2023-01-13: qty 100

## 2023-01-13 MED ORDER — ENOXAPARIN SODIUM 30 MG/0.3ML IJ SOSY
30.0000 mg | PREFILLED_SYRINGE | INTRAMUSCULAR | Status: DC
  Administered 2023-01-13 – 2023-01-16 (×4): 30 mg via SUBCUTANEOUS
  Filled 2023-01-13 (×4): qty 0.3

## 2023-01-13 NOTE — Inpatient Diabetes Management (Signed)
Inpatient Diabetes Program Recommendations  AACE/ADA: New Consensus Statement on Inpatient Glycemic Control (2015)  Target Ranges:  Prepandial:   less than 140 mg/dL      Peak postprandial:   less than 180 mg/dL (1-2 hours)      Critically ill patients:  140 - 180 mg/dL   Lab Results  Component Value Date   GLUCAP >600 ([place]) 01/13/2023   HGBA1C 6.1 ([facility]) 12/17/2016    Review of Glycemic Control  Diabetes history: DM 2 (diagnosed 2-3 years ago placed on metformin) Outpatient Diabetes medications: metformin Current orders for Inpatient glycemic control:  IV insulin/Endotool  Saw PCP 5-6 months ago Pt noticed symptoms started 3-4 weeks ago.  Spoke with pt at bedside regarding glucose levels on presentation and that he may need insulin at time of d/c. Pt is very thin and will need a very small pen needle at time of d/c for insulin. Even though pt was on metformin outpatient, he did not take it consistently and was never prescribed a glucometer for glucose trends at home. Discussed basic pathophysiology of DM 2. Reviewed glucose and A1c goals for home. Reviewed diet and beverage modifications. Showed pt the insulin pen. Will order the living well with diabetes booklet for review prior to going home.   I would like for pt to check a glucose and give an injection prior to being discharged, will place care order instruction for nursing staff to assist in this.  D/c needs: Glucose meter kit Insulin pen needles  Basal insulin covered by insurance  Thanks,  Tama Headings RN, MSN, BC-ADM Inpatient Diabetes Coordinator Team Pager [phone] (8a-5p)

## 2023-01-13 NOTE — ED Notes (Signed)
Recollected lt green top, tubed to lab.

## 2023-01-13 NOTE — ED Triage Notes (Signed)
Pt BIB [facility] from home C/O dizziness generalized weakness since yesterday. Also reports 35 lb unintentional weight loss over the last month.

## 2023-01-13 NOTE — H&P (Signed)
History and Physical    Patient: Dominic Molina [id number] DOB: Apr 05, 1956 DOA: 01/13/2023 DOS: the patient was seen and examined on 01/13/2023 PCP: Patient, No Pcp Per  Patient coming from: Home  Chief Complaint:  Chief Complaint  Patient presents with   Dizziness   HPI: Dominic Molina is a 67 y.o. male with medical history significant of type 2 diabetes, hyperlipidemia, heroin overdose, tobacco use, polysubstance abuse most recently used cocaine yesterday who presented to the emergency department with blurry vision, dizziness, polyuria and polydipsia.  He also complains of having a 35 pound unintentional weight loss in the last month. He denied fever, chills, rhinorrhea, sore throat, wheezing or hemoptysis.  No chest pain, palpitations, diaphoresis, PND, orthopnea or pitting edema of the lower extremities.  No abdominal pain, nausea, emesis, diarrhea, constipation, melena or hematochezia.  No flank pain, dysuria, frequency or hematuria.  ED course: Initial vital signs were temperature 97.4 F, pulse 89, respiration 20, BP 190/124 mmHg O2 sat 97% on room air.  The patient received 2000 mL normal saline bolus, 1180 mL of LR bolus and was started on an insulin infusion.  Lab work: His urinalysis showed colorless urine, moderate hemoglobinuria, glucosuria more than 500 and ketonuria 5 mg/dL.  CBC with a white count 9.1, hemoglobin 16.8 g/dL with an MCV of 74.0 fL and platelets 213.  Lipase is normal.  Venous blood gas with a pH of 7.22, pCO2 of 59 and pO2 of less than 31 mmHg.  Bicarbonate 24.1 and acid-base deficit 5.0 mmol/L.  Beta hydroxybutyric acid 1.8 mmol/L.  CMP with a sodium 117 (corrected sodium 164), potassium 5.5, chloride 80 and CO2 22 mmol/L with a normal anion gap.  Glucose 1117, BUN 51 and creatinine 3.34 mg/dL.  LFTs were normal.   Review of Systems: As mentioned in the history of present illness. All other systems reviewed and are negative. Past Medical History:  Diagnosis  Date   Diabetes mellitus without complication ([place])    Heroin overdose ([place]) 12/17/2016   Polysubstance abuse ([place])    History reviewed. No pertinent surgical history. Social History:  reports that he has been smoking. He has never used smokeless tobacco. He reports current alcohol use. He reports current drug use. Drugs: Cocaine, Marijuana, and Heroin.  No Known Allergies  Family History  Problem Relation Age of Onset   Hypertension Other     Prior to Admission medications   Medication Sig Start Date End Date Taking? Authorizing Provider  atorvastatin (LIPITOR) 20 MG tablet Take 20 mg by mouth at bedtime. 12/27/22  Yes [provider]  losartan-hydrochlorothiazide (HYZAAR) 100-25 MG tablet Take 1 tablet by mouth every morning. 12/24/22  Yes [provider]  metFORMIN (GLUCOPHAGE-XR) 500 MG 24 hr tablet Take 500 mg by mouth every morning. 12/27/22  Yes [provider]  amLODipine (NORVASC) 5 MG tablet Take 1 tablet (5 mg total) by mouth daily. 12/19/16   Rosita Fire, MD    Physical Exam: Vitals:   01/13/23 1015 01/13/23 1018 01/13/23 1030 01/13/23 1215  BP:  (!) 190/124 (!) 184/119 (!) 179/113  Pulse:  89 84 79  Resp:  20 13 13   Temp:  (!) 97.4 F (36.3 C)    TempSrc:  Oral    SpO2:  97% 97% 97%  Weight: 59 kg     Height: 5\' 7"  (1.702 m)      Physical Exam Vitals and nursing note reviewed.  Constitutional:      Appearance: Normal appearance.  HENT:     Head: Normocephalic.     Nose: No rhinorrhea.     Mouth/Throat:     Mouth: Mucous membranes are dry.  Eyes:     Pupils: Pupils are equal, round, and reactive to light.  Neck:     Vascular: No JVD.  Cardiovascular:     Rate and Rhythm: Normal rate and regular rhythm.     Heart sounds: S1 normal and S2 normal.  Pulmonary:     Effort: Pulmonary effort is normal.     Breath sounds: Normal breath sounds.  Abdominal:     General: Bowel sounds are normal. There is no distension.      Palpations: Abdomen is soft.     Tenderness: There is no abdominal tenderness. There is no right CVA tenderness, left CVA tenderness or guarding.  Musculoskeletal:     Cervical back: Neck supple.     Right lower leg: No edema.     Left lower leg: No edema.  Skin:    General: Skin is warm and dry.  Neurological:     General: No focal deficit present.     Mental Status: He is alert.  Psychiatric:        Mood and Affect: Mood normal.        Behavior: Behavior normal.     Data Reviewed:  Results are pending, will review when available.  Assessment and Plan: Principal Problem:   Diabetic hyperosmolar non-ketotic state ([place]) Observation/stepdown. Keep NPO. Continue IV fluids. Continue insulin infusion. Monitor CBG closely. BMP every 4 hours. Replace electrolytes as needed. Consult diabetes coordinator. Transition to SQ insulin per Endo tool.  Active Problems:   AKI (acute kidney injury) ([place]) Continue IV fluids. Avoid hypotension. Avoid nephrotoxins. Monitor intake and output. Monitor renal function electrolytes.    Hypernatremia  Corrected sodium is 164 mmol/L. Calculated free water deficit is 5.1. Continue IV fluids and current treatment as above.    Hyperkalemia Continue insulin infusion/IVF. Follow-up potassium level.    Polysubstance abuse ([place]) Cessation advised. TOC team evaluation in 24 to 48 hours.    Hyperlipidemia History of noncompliance with therapy. Currently not on medical therapy. Needs to establish with a PCP.    Tobacco abuse He stated this is mild and episodic. Declined nicotine replacement therapy for now. Tobacco cessation advised.    Unintentional weight loss Likely from dehydration and cocaine use. Will need further workup after more stable.    Oral candidiasis  Begin nystatin swish and swallow 4 times daily. Check HIV testing in the morning.      Advance Care Planning:   Code Status: Full Code   Consults:   Family  Communication:   Severity of Illness: The appropriate patient status for this patient is OBSERVATION. Observation status is judged to be reasonable and necessary in order to provide the required intensity of service to ensure the patient's safety. The patient's presenting symptoms, physical exam findings, and initial radiographic and laboratory data in the context of their medical condition is felt to place them at decreased risk for further clinical deterioration. Furthermore, it is anticipated that the patient will be medically stable for discharge from the hospital within 2 midnights of admission.   Author: Reubin Milan, MD 01/13/2023 1:18 PM  For on call review [email or web address].   This document was prepared using Dragon voice recognition software and may contain some unintended transcription errors.

## 2023-01-13 NOTE — ED Notes (Signed)
Hospitalist at bedside assessing pt at this time.  

## 2023-01-13 NOTE — ED Triage Notes (Signed)
Pt reports dizziness and generalized weakness X 2-3 weeks. Reports increased thirst, increased urination X 3 days. CBG HIGH in triage.

## 2023-01-13 NOTE — ED Provider Notes (Addendum)
[place] EMERGENCY DEPARTMENT AT [facility] Provider Note   CSN: [id number] Arrival date & time: 01/13/23  1006     History  Chief Complaint  Patient presents with   Dizziness    Dominic Molina is a 67 y.o. male. With past medical history of polysubstance abuse, type 2 diabetes who presents to the emergency department with dizziness.  Presents with multiple symptoms. Patient states that over the past week he has been very thirsty and hungry. States he has been drinking fluids without improvement in his symptoms. He also notes he has had increased urination and incontinence due to this. He states over the past 2 days he has felt intermittently lightheaded and dizzy and has at times become disoriented in his own home. He describes having intermittent headache, blurred vision. He denies falls. He denies abdominal pain, nausea, vomiting, diarrhea, dysuria, fever, chest pain, shortness of breath, palpitations. Denies weakness of extremities, numbness or tingling, speech difficulty. He states he sometimes takes his metformin, he does not check his blood glucose at home.    Dizziness Associated symptoms: headaches        Home Medications Prior to Admission medications   Medication Sig Start Date End Date Taking? Authorizing Provider  amLODipine (NORVASC) 5 MG tablet Take 1 tablet (5 mg total) by mouth daily. 12/19/16  Yes Rosita Fire, MD  atorvastatin (LIPITOR) 20 MG tablet Take 20 mg by mouth at bedtime. 12/27/22  Yes [provider]  losartan-hydrochlorothiazide (HYZAAR) 100-25 MG tablet Take 1 tablet by mouth every morning. 12/24/22  Yes [provider]  metFORMIN (GLUCOPHAGE-XR) 500 MG 24 hr tablet Take 500 mg by mouth every morning. 12/27/22  Yes [provider]      Allergies    Patient has no known allergies.    Review of Systems   Review of Systems  Eyes:  Positive for visual disturbance.  Endocrine: Positive for polydipsia,  polyphagia and polyuria.  Genitourinary:  Positive for frequency.  Neurological:  Positive for dizziness and headaches.  All other systems reviewed and are negative.   Physical Exam Updated Vital Signs BP (!) 179/113   Pulse 79   Temp (!) 97.4 F (36.3 C) (Oral)   Resp 13   Ht 5\' 7"  (1.702 m)   Wt 59 kg   SpO2 97%   BMI 20.36 kg/m  Physical Exam Vitals and nursing note reviewed.  Constitutional:      General: He is not in acute distress.    Appearance: He is underweight. He is ill-appearing.  HENT:     Head: Normocephalic.     Mouth/Throat:     Mouth: Mucous membranes are dry.  Eyes:     General: Vision grossly intact. Gaze aligned appropriately. No scleral icterus.    Extraocular Movements: Extraocular movements intact.     Right eye: No nystagmus.     Left eye: No nystagmus.     Pupils: Pupils are equal, round, and reactive to light.  Cardiovascular:     Rate and Rhythm: Normal rate and regular rhythm.     Pulses:          Radial pulses are 1+ on the right side and 1+ on the left side.     Heart sounds: Normal heart sounds.  Pulmonary:     Effort: Pulmonary effort is normal. No tachypnea or respiratory distress.     Breath sounds: Normal breath sounds.  Abdominal:     General: Abdomen is flat. Bowel sounds  are normal. There is no distension.     Palpations: Abdomen is soft.     Tenderness: There is no abdominal tenderness.  Musculoskeletal:     Right lower leg: No edema.     Left lower leg: No edema.  Skin:    General: Skin is warm and dry.     Capillary Refill: Capillary refill takes less than 2 seconds.  Neurological:     General: No focal deficit present.     Mental Status: He is alert and oriented to person, place, and time.     Cranial Nerves: Cranial nerves 2-12 are intact. No dysarthria or facial asymmetry.     Sensory: Sensation is intact.     Motor: Motor function is intact.  Psychiatric:        Mood and Affect: Mood normal.        Behavior:  Behavior normal.     ED Results / Procedures / Treatments   Labs (all labs ordered are listed, but only abnormal results are displayed) Labs Reviewed  CBC WITH DIFFERENTIAL/PLATELET - Abnormal; Notable for the following components:      Result Value   RBC 6.61 (*)    MCV 74.0 (*)    MCH 25.4 (*)    RDW 17.3 (*)    Neutro Abs 8.1 (*)    Lymphs Abs 0.6 (*)    All other components within normal limits  BLOOD GAS, VENOUS - Abnormal; Notable for the following components:   pH, Ven 7.22 (*)    pO2, Ven <31 (*)    Acid-base deficit 5.0 (*)    All other components within normal limits  URINALYSIS, W/ REFLEX TO CULTURE (INFECTION SUSPECTED) - Abnormal; Notable for the following components:   Color, Urine COLORLESS (*)    Glucose, UA >=500 (*)    Hgb urine dipstick MODERATE (*)    Ketones, ur 5 (*)    All other components within normal limits  BETA-HYDROXYBUTYRIC ACID - Abnormal; Notable for the following components:   Beta-Hydroxybutyric Acid 1.80 (*)    All other components within normal limits  COMPREHENSIVE METABOLIC PANEL - Abnormal; Notable for the following components:   Sodium 117 (*)    Potassium 5.5 (*)    Chloride 80 (*)    Glucose, Bld 1,117 (*)    BUN 51 (*)    Creatinine, Ser 3.34 (*)    GFR, Estimated 19 (*)    All other components within normal limits  CBG MONITORING, ED - Abnormal; Notable for the following components:   Glucose-Capillary >600 (*)    All other components within normal limits  LIPASE, BLOOD  OSMOLALITY  BASIC METABOLIC PANEL  BASIC METABOLIC PANEL  BASIC METABOLIC PANEL  BASIC METABOLIC PANEL  URINALYSIS, ROUTINE W REFLEX MICROSCOPIC  HEMOGLOBIN A1C    EKG EKG Interpretation  Date/Time:  Friday January 13 2023 10:17:30 EDT Ventricular Rate:  80 PR Interval:  138 QRS Duration: 90 QT Interval:  385 QTC Calculation: 445 R Axis:   83 Text Interpretation: Sinus rhythm Right atrial enlargement Borderline right axis deviation Consider left  ventricular hypertrophy T wave abnormality Abnormal ECG Confirmed by Carmin Muskrat [phone]) on 01/13/2023 10:20:11 AM  Radiology No results found.  Procedures .Critical Care  Performed by: Mickie Hillier, PA-C Authorized by: Mickie Hillier, PA-C   Critical care provider statement:    Critical care time (minutes):  35   Critical care time was exclusive of:  Separately billable procedures and treating other patients  Critical care was necessary to treat or prevent imminent or life-threatening deterioration of the following conditions:  Endocrine crisis   Critical care was time spent personally by me on the following activities:  Development of treatment plan with patient or surrogate, discussions with consultants, evaluation of patient's response to treatment, examination of patient, interpretation of cardiac output measurements, obtaining history from patient or surrogate, review of old charts, re-evaluation of patient's condition, pulse oximetry, ordering and review of radiographic studies, ordering and review of laboratory studies and ordering and performing treatments and interventions   I assumed direction of critical care for this patient from another provider in my specialty: no     Care discussed with: admitting provider      Medications Ordered in ED Medications  insulin regular, human (MYXREDLIN) 100 units/ 100 mL infusion (has no administration in time range)  lactated ringers infusion (has no administration in time range)  dextrose 5 % in lactated ringers infusion (has no administration in time range)  dextrose 50 % solution 0-50 mL (has no administration in time range)  sodium chloride 0.9 % bolus 1,000 mL (0 mLs Intravenous Stopped 01/13/23 1200)  sodium chloride 0.9 % bolus 1,000 mL (1,000 mLs Intravenous New Bag/Given 01/13/23 1318)  lactated ringers bolus 1,180 mL (1,180 mLs Intravenous New Bag/Given 01/13/23 1317)    ED Course/ Medical Decision Making/ A&P  Medical  Decision Making Amount and/or Complexity of Data Reviewed Labs: ordered.  Risk Prescription drug management. Decision regarding hospitalization.  Initial Impression and Ddx 67 year old male who presents to the emergency department with polyuria, dipsia phagia, dizziness and intermittent confusion Patient PMH that increases complexity of ED encounter:  diabetes, polysubstance abuse  Interpretation of Diagnostics I independent reviewed and interpreted the labs as followed: pH 7.22, bicarb 59.  Glucose 1117, sodium 117, corrected to 138.  Potassium is 5.5.  BHB mildly elevated.  Osmolality pending  - I independently visualized the following imaging with scope of interpretation limited to determining acute life threatening conditions related to emergency care: Not indicated  Patient Reassessment and Ultimate Disposition/Management Initial presentation with underweight male. Appears dry. He is in no acute distress. Non focal neurological exam. Hypertensive. Initial BG >600. Will order DKA/HHS work up, EKG. Start IVF. NPO.   Started IV fluids Acidotic but his bicarb appears normal.  Will wait for CMP to result prior to starting Endo tool.  Glucose resulted at 1100.  His sodium correction is normal.  Potassium is 5.5.  His creatinine appears similar to last value at 3.3.  With his report of confusion at home and labs as above I think that he is in [facility] and not DKA.  Started individual.  Giving another liter of IV fluids. His has a non focal neurological exam. I do not think he needs CT at this time for reported confusion at home. Believe confusion is related to his metabolic condition.   I consulted and spoke with Dr. Olevia Bowens who will admit the patient.  Patient management required discussion with the following services or consulting groups:  Hospitalist Service  Complexity of Problems Addressed Acute illness or injury that poses threat of life of bodily function  Additional Data Reviewed and  Analyzed Further history obtained from: Further history from spouse/family member, Past medical history and medications listed in the EMR, Prior ED visit notes, Care Everywhere, and Prior labs/imaging results  Patient Encounter Risk Assessment SDOH impact on management and Consideration of hospitalization  Final Clinical Impression(s) / ED Diagnoses Final diagnoses:  Hyperosmolar hyperglycemic state (HHS) [facility])    Rx / DC Orders ED Discharge Orders     None         Mickie Hillier, PA-C 01/13/23 1329    Mickie Hillier, PA-C 01/13/23 1330    Carmin Muskrat, MD 01/13/23 [phone]

## 2023-01-13 NOTE — Progress Notes (Signed)
Notified by [facility] of employee exposure to patient's blood in lab. Patient notified that exposure panel would be collected.

## 2023-01-13 NOTE — ED Notes (Signed)
Called to check on status of CMP, lipase, beta-hydro, osmolality. Per lab, osmolality was sent to [facility] for result, and the rest should be resulting shortly.

## 2023-01-14 DIAGNOSIS — E1100 Type 2 diabetes mellitus with hyperosmolarity without nonketotic hyperglycemic-hyperosmolar coma (NKHHC): Secondary | ICD-10-CM | POA: Diagnosis not present

## 2023-01-14 LAB — GLUCOSE, CAPILLARY
Glucose-Capillary: 124 mg/dL — ABNORMAL HIGH (ref 70–99)
Glucose-Capillary: 133 mg/dL — ABNORMAL HIGH (ref 70–99)
Glucose-Capillary: 142 mg/dL — ABNORMAL HIGH (ref 70–99)
Glucose-Capillary: 148 mg/dL — ABNORMAL HIGH (ref 70–99)
Glucose-Capillary: 148 mg/dL — ABNORMAL HIGH (ref 70–99)
Glucose-Capillary: 152 mg/dL — ABNORMAL HIGH (ref 70–99)
Glucose-Capillary: 159 mg/dL — ABNORMAL HIGH (ref 70–99)
Glucose-Capillary: 168 mg/dL — ABNORMAL HIGH (ref 70–99)
Glucose-Capillary: 203 mg/dL — ABNORMAL HIGH (ref 70–99)
Glucose-Capillary: 210 mg/dL — ABNORMAL HIGH (ref 70–99)
Glucose-Capillary: 219 mg/dL — ABNORMAL HIGH (ref 70–99)
Glucose-Capillary: 317 mg/dL — ABNORMAL HIGH (ref 70–99)
Glucose-Capillary: 343 mg/dL — ABNORMAL HIGH (ref 70–99)

## 2023-01-14 LAB — CBC
HCT: 38 % — ABNORMAL LOW (ref 39.0–52.0)
Hemoglobin: 13.6 g/dL (ref 13.0–17.0)
MCH: 25.4 pg — ABNORMAL LOW (ref 26.0–34.0)
MCHC: 35.8 g/dL (ref 30.0–36.0)
MCV: 70.9 fL — ABNORMAL LOW (ref 80.0–100.0)
Platelets: 210 10*3/uL (ref 150–400)
RBC: 5.36 MIL/uL (ref 4.22–5.81)
RDW: 14.7 % (ref 11.5–15.5)
WBC: 6.9 10*3/uL (ref 4.0–10.5)
nRBC: 0 % (ref 0.0–0.2)

## 2023-01-14 LAB — COMPREHENSIVE METABOLIC PANEL
ALT: 26 U/L (ref 0–44)
AST: 28 U/L (ref 15–41)
Albumin: 3.1 g/dL — ABNORMAL LOW (ref 3.5–5.0)
Alkaline Phosphatase: 90 U/L (ref 38–126)
Anion gap: 9 (ref 5–15)
BUN: 42 mg/dL — ABNORMAL HIGH (ref 8–23)
CO2: 27 mmol/L (ref 22–32)
Calcium: 9 mg/dL (ref 8.9–10.3)
Chloride: 100 mmol/L (ref 98–111)
Creatinine, Ser: 2.39 mg/dL — ABNORMAL HIGH (ref 0.61–1.24)
GFR, Estimated: 29 mL/min — ABNORMAL LOW (ref >60)
Glucose, Bld: 157 mg/dL — ABNORMAL HIGH (ref 70–99)
Potassium: 3.4 mmol/L — ABNORMAL LOW (ref 3.5–5.1)
Sodium: 136 mmol/L (ref 135–145)
Total Bilirubin: 1.2 mg/dL (ref 0.3–1.2)
Total Protein: 6.4 g/dL — ABNORMAL LOW (ref 6.5–8.1)

## 2023-01-14 LAB — BASIC METABOLIC PANEL
Anion gap: 7 (ref 5–15)
BUN: 39 mg/dL — ABNORMAL HIGH (ref 8–23)
CO2: 25 mmol/L (ref 22–32)
Calcium: 8.6 mg/dL — ABNORMAL LOW (ref 8.9–10.3)
Chloride: 101 mmol/L (ref 98–111)
Creatinine, Ser: 2.15 mg/dL — ABNORMAL HIGH (ref 0.61–1.24)
GFR, Estimated: 33 mL/min — ABNORMAL LOW (ref >60)
Glucose, Bld: 158 mg/dL — ABNORMAL HIGH (ref 70–99)
Potassium: 3.2 mmol/L — ABNORMAL LOW (ref 3.5–5.1)
Sodium: 133 mmol/L — ABNORMAL LOW (ref 135–145)

## 2023-01-14 LAB — HIV ANTIBODY (ROUTINE TESTING W REFLEX): HIV Screen 4th Generation wRfx: NONREACTIVE

## 2023-01-14 LAB — OSMOLALITY: Osmolality: 299 mOsm/kg — ABNORMAL HIGH (ref 275–295)

## 2023-01-14 LAB — BETA-HYDROXYBUTYRIC ACID: Beta-Hydroxybutyric Acid: 0.06 mmol/L (ref 0.05–0.27)

## 2023-01-14 MED ORDER — INSULIN DETEMIR 100 UNIT/ML ~~LOC~~ SOLN: 5.0000 [IU] | Freq: Two times a day (BID) | SUBCUTANEOUS | Status: DC

## 2023-01-14 MED ORDER — POTASSIUM CHLORIDE CRYS ER 20 MEQ PO TBCR
20.0000 meq | EXTENDED_RELEASE_TABLET | Freq: Once | ORAL | Status: AC
  Administered 2023-01-14: 20 meq via ORAL
  Filled 2023-01-14: qty 1

## 2023-01-14 MED ORDER — HYDRALAZINE HCL 25 MG PO TABS
25.0000 mg | ORAL_TABLET | Freq: Three times a day (TID) | ORAL | Status: DC
  Administered 2023-01-14 – 2023-01-16 (×5): 25 mg via ORAL
  Filled 2023-01-14 (×5): qty 1

## 2023-01-14 MED ORDER — INSULIN ASPART 100 UNIT/ML IJ SOLN: 0.0000 [IU] | Freq: Every day | INTRAMUSCULAR | Status: DC

## 2023-01-14 MED ORDER — PNEUMOCOCCAL 20-VAL CONJ VACC 0.5 ML IM SUSY: 0.5000 mL | PREFILLED_SYRINGE | INTRAMUSCULAR | Status: DC | PRN

## 2023-01-14 MED ORDER — INSULIN ASPART 100 UNIT/ML IJ SOLN: 1.0000 [IU] | INTRAMUSCULAR | Status: DC

## 2023-01-14 MED ORDER — INSULIN DETEMIR 100 UNIT/ML ~~LOC~~ SOLN
10.0000 [IU] | Freq: Two times a day (BID) | SUBCUTANEOUS | Status: DC
  Administered 2023-01-14: 10 [IU] via SUBCUTANEOUS
  Filled 2023-01-14 (×2): qty 0.1

## 2023-01-14 MED ORDER — INSULIN ASPART 100 UNIT/ML IJ SOLN
0.0000 [IU] | Freq: Three times a day (TID) | INTRAMUSCULAR | Status: DC
  Administered 2023-01-14: 3 [IU] via SUBCUTANEOUS
  Administered 2023-01-14: 7 [IU] via SUBCUTANEOUS

## 2023-01-14 MED ORDER — INFLUENZA VAC A&B SA ADJ QUAD 0.5 ML IM PRSY: 0.5000 mL | PREFILLED_SYRINGE | INTRAMUSCULAR | Status: DC | PRN

## 2023-01-14 MED ORDER — AMLODIPINE BESYLATE 10 MG PO TABS
10.0000 mg | ORAL_TABLET | Freq: Every day | ORAL | Status: DC
  Administered 2023-01-14 – 2023-01-17 (×4): 10 mg via ORAL
  Filled 2023-01-14 (×4): qty 1

## 2023-01-14 MED ORDER — INSULIN DETEMIR 100 UNIT/ML ~~LOC~~ SOLN
5.0000 [IU] | Freq: Two times a day (BID) | SUBCUTANEOUS | Status: DC
  Administered 2023-01-14: 5 [IU] via SUBCUTANEOUS
  Filled 2023-01-14 (×2): qty 0.05

## 2023-01-14 MED ORDER — POTASSIUM CHLORIDE CRYS ER 20 MEQ PO TBCR
40.0000 meq | EXTENDED_RELEASE_TABLET | Freq: Once | ORAL | Status: AC
  Administered 2023-01-14: 40 meq via ORAL
  Filled 2023-01-14: qty 2

## 2023-01-14 MED ORDER — LABETALOL HCL 5 MG/ML IV SOLN
10.0000 mg | INTRAVENOUS | Status: DC | PRN
  Administered 2023-01-17: 10 mg via INTRAVENOUS
  Filled 2023-01-14 (×2): qty 4

## 2023-01-14 MED ORDER — INSULIN ASPART 100 UNIT/ML IJ SOLN
0.0000 [IU] | Freq: Three times a day (TID) | INTRAMUSCULAR | Status: DC
  Administered 2023-01-15: 5 [IU] via SUBCUTANEOUS
  Administered 2023-01-15 (×2): 3 [IU] via SUBCUTANEOUS
  Administered 2023-01-16: 15 [IU] via SUBCUTANEOUS
  Administered 2023-01-16 (×2): 3 [IU] via SUBCUTANEOUS
  Administered 2023-01-17: 5 [IU] via SUBCUTANEOUS

## 2023-01-14 MED ORDER — INSULIN ASPART 100 UNIT/ML IJ SOLN
0.0000 [IU] | Freq: Every day | INTRAMUSCULAR | Status: DC
  Administered 2023-01-14: 4 [IU] via SUBCUTANEOUS
  Administered 2023-01-15: 5 [IU] via SUBCUTANEOUS
  Administered 2023-01-16: 3 [IU] via SUBCUTANEOUS

## 2023-01-14 NOTE — Inpatient Diabetes Management (Signed)
Inpatient Diabetes Program Recommendations  AACE/ADA: New Consensus Statement on Inpatient Glycemic Control (2015)  Target Ranges:  Prepandial:   less than 140 mg/dL      Peak postprandial:   less than 180 mg/dL (1-2 hours)      Critically ill patients:  140 - 180 mg/dL   Lab Results  Component Value Date   GLUCAP 210 (H) 01/14/2023   HGBA1C 6.1 ([place]) 12/17/2016    Review of Glycemic Control  Latest Reference Range & Units 01/14/23 07:38 01/14/23 08:47 01/14/23 10:10  Glucose-Capillary 70 - 99 mg/dL 124 (H) 142 (H) 210 (H)  (H): Data is abnormally high Diabetes history: DM 2 (diagnosed 2-3 years ago placed on metformin) Outpatient Diabetes medications: metformin Current orders for Inpatient glycemic control: Iv insulin to Levemir 5 units BID, Novolog 0-9 units TID & HS  A1C in process  Inpatient Diabetes Program Recommendations:    Noted orders for transition, will follow.   Attempted to reach back out to patient to reinforce discussion from 3/22. No answer. Secure chat sent to RN to help assist with self injection today.   Thanks, Bronson Curb, MSN, RNC-OB Diabetes Coordinator [phone] (8a-5p)

## 2023-01-14 NOTE — Progress Notes (Signed)
PROGRESS NOTE  Dominic Molina  [id number] DOB: 1956/06/09 DOA: 01/13/2023 PCP: Patient, No Pcp Per   Brief Narrative: Patient is a 67 year old male with history of diabetes type 2, hyperlipidemia, tobacco use/polysubstance abuse using cocaine who presents with blurry vision, dizziness, polyuria, polydipsia, weight loss of 35 pounds in last month.  On presentation ,he was hypertensive.  Urinalysis showed glucosuria, ketonuria.  Blood work showed sodium of 117, potassium of 5.5, glucose of 117, creatinine of 3.34, anion gap of 15.  Patient was admitted for management of diabetic hyperosmolar nonketotic state, insulin drip was started.  Diabetic coordinator consulted.  Planning to follow-up on hemoglobin A1c regarding the recommendation on diabetic medication which might include insulin before discharge.  Assessment & Plan:  Principal Problem:   Diabetic hyperosmolar non-ketotic state ([place]) Active Problems:   Hyperkalemia   Polysubstance abuse (HCC)   AKI (acute kidney injury) ([place])   Hyperlipidemia   Tobacco abuse   Unintentional weight loss   Hypernatremia   Oral candidiasis   Diabetic hyperosmolar nonketotic state: Presented with severe hyperglycemia and symptoms of blurry vision, dizziness, polyuria, polydipsia, weight loss. Glucose more than 1000 on presentation.  Takes metformin only at home and patient is noncompliant.  Patient was started on insulin drip. Blood sugars have been improved, insulin drip stopped, currently on long-acting and sliding scale.  Diabetic coordinator following.  Hemoglobin A1c pending. Patient may need insulin on discharge, will continue to monitor blood sugars and wait for diabetic monitor recommendation on diabetic medication regimen.  AKI on CKD: On reviewing his previous lab works, his creatinine has fluctuated from 1.4-3.  Unclear baseline creatinine.  Presented with creatinine in the range of 3.  AKI was associated with volume loss secondary to  polyuria.  Kidney function improving with IV fluids.  Continue to monitor kidney function.  He needs follow-up with nephrology as an outpatient.  Hyperosmolar hyponatremia: Secondary to severe hyperglycemia.  Already corrected  Hypokalemia: Hyperkalemic on presentation, no hypokalemia, potassium being monitored and supplemented  Hyperlipidemia: Noncompliance.  Currently not on medical therapy.  Will check lipid panel  Tobacco use/cocaine abuse: Smokes, takes cocaine.  Declined nicotine patch.  Cessation advised  Oral candidiasis: Started on nystatin swish and swallow.  HIV pending  Unintentional weight loss: Likely from dehydration/volume loss from polyuria.  BMI of 15.6.We will consult nutritionist        DVT prophylaxis:enoxaparin (LOVENOX) injection 30 mg Start: 01/13/23 2200     Code Status: Full Code  Family Communication: Called and discussed with niece on phone today  Patient status:Obs  Patient is from :home  Anticipated discharge [id number]  Estimated DC date:tomorrow   Consultants: None  Procedures:None  Antimicrobials:  Anti-infectives (From admission, onward)    None       Subjective: Patient seen and examined at bedside today.  Hemodynamically stable comfortable, alert and oriented.  Denies any complaints.  Feels much better.  Long discussion had at the bedside about his uncontrolled diabetes, plan to wait on A1c level and potential need of insulin will go home  Objective: Vitals:   01/14/23 0339 01/14/23 0400 01/14/23 0500 01/14/23 0600  BP:  (!) 142/87 130/82 (!) 151/101  Pulse:  74 76 76  Resp:  15 (!) 9 15  Temp: 98.2 F (36.8 C)     TempSrc: Oral     SpO2:  94% 96% 96%  Weight:  54 kg    Height:        Intake/Output Summary (Last 24 hours) at  01/14/2023 0744 Last data filed at 01/14/2023 0532 Gross per 24 hour  Intake 4186.48 ml  Output 1000 ml  Net 3186.48 ml   Filed Weights   01/13/23 1015 01/13/23 1431 01/14/23 0400  Weight: 59  kg 53.1 kg 54 kg    Examination:  General exam: Overall comfortable, not in distress,thin built HEENT: PERRL Respiratory system:  no wheezes or crackles  Cardiovascular system: S1 & S2 heard, RRR.  Gastrointestinal system: Abdomen is nondistended, soft and nontender. Central nervous system: Alert and oriented Extremities: No edema, no clubbing ,no cyanosis Skin: No rashes, no ulcers,no icterus     Data Reviewed: I have personally reviewed following labs and imaging studies  CBC: Recent Labs  Lab 01/13/23 1032 01/14/23 0530  WBC 9.1 6.9  NEUTROABS 8.1*  --   HGB 16.8 13.6  HCT 48.9 38.0*  MCV 74.0* 70.9*  PLT 213 [id number]   Basic Metabolic Panel: Recent Labs  Lab 01/13/23 1506 01/13/23 1658 01/13/23 2034 01/14/23 0113 01/14/23 0530  NA 125* 129* 132* 136 133*  K 4.4 4.4 3.6 3.4* 3.2*  CL 91* 94* 98 100 101  CO2 20* 21* 22 27 25   GLUCOSE 749* 484* 173* 157* 158*  BUN 47* 44* 43* 42* 39*  CREATININE 2.93* 2.70* 2.56* 2.39* 2.15*  CALCIUM 8.7* 9.0 8.9 9.0 8.6*     Recent Results (from the past 240 hour(s))  MRSA Next Gen by PCR, Nasal     Status: None   Collection Time: 01/13/23  2:30 PM   Specimen: Nasal Mucosa; Nasal Swab  Result Value Ref Range Status   MRSA by PCR Next Gen NOT DETECTED NOT DETECTED Final    Comment: (NOTE) The GeneXpert MRSA Assay (FDA approved for NASAL specimens only), is one component of a comprehensive MRSA colonization surveillance program. It is not intended to diagnose MRSA infection nor to guide or monitor treatment for MRSA infections. Test performance is not FDA approved in patients less than 71 years old. Performed at [facility], [place] [address] [place], [place] [address]      Radiology Studies: No results found.  Scheduled Meds:  Chlorhexidine Gluconate Cloth  6 each Topical Daily   enoxaparin (LOVENOX) injection  30 mg Subcutaneous Q24H   influenza vaccine adjuvanted  0.5 mL Intramuscular  Tomorrow-1000   insulin aspart  0-5 Units Subcutaneous QHS   insulin aspart  0-9 Units Subcutaneous TID WC   insulin detemir  5 Units Subcutaneous Q12H   nystatin  5 mL Oral QID   pneumococcal 20-valent conjugate vaccine  0.5 mL Intramuscular Tomorrow-1000   potassium chloride  40 mEq Oral Once   Continuous Infusions:  lactated ringers Stopped (01/13/23 1932)     LOS: 0 days   Shelly Coss, MD [facility] P3/23/2024, 7:44 AM

## 2023-01-15 DIAGNOSIS — Z79899 Other long term (current) drug therapy: Secondary | ICD-10-CM | POA: Diagnosis not present

## 2023-01-15 DIAGNOSIS — F17200 Nicotine dependence, unspecified, uncomplicated: Secondary | ICD-10-CM | POA: Diagnosis present

## 2023-01-15 DIAGNOSIS — E875 Hyperkalemia: Secondary | ICD-10-CM | POA: Diagnosis present

## 2023-01-15 DIAGNOSIS — E1100 Type 2 diabetes mellitus with hyperosmolarity without nonketotic hyperglycemic-hyperosmolar coma (NKHHC): Secondary | ICD-10-CM | POA: Diagnosis present

## 2023-01-15 DIAGNOSIS — R81 Glycosuria: Secondary | ICD-10-CM | POA: Diagnosis present

## 2023-01-15 DIAGNOSIS — E43 Unspecified severe protein-calorie malnutrition: Secondary | ICD-10-CM | POA: Diagnosis present

## 2023-01-15 DIAGNOSIS — Z91148 Patient's other noncompliance with medication regimen for other reason: Secondary | ICD-10-CM | POA: Diagnosis not present

## 2023-01-15 DIAGNOSIS — B370 Candidal stomatitis: Secondary | ICD-10-CM | POA: Diagnosis present

## 2023-01-15 DIAGNOSIS — H538 Other visual disturbances: Secondary | ICD-10-CM | POA: Diagnosis present

## 2023-01-15 DIAGNOSIS — I129 Hypertensive chronic kidney disease with stage 1 through stage 4 chronic kidney disease, or unspecified chronic kidney disease: Secondary | ICD-10-CM | POA: Diagnosis present

## 2023-01-15 DIAGNOSIS — R42 Dizziness and giddiness: Secondary | ICD-10-CM | POA: Diagnosis present

## 2023-01-15 DIAGNOSIS — R824 Acetonuria: Secondary | ICD-10-CM | POA: Diagnosis present

## 2023-01-15 DIAGNOSIS — E876 Hypokalemia: Secondary | ICD-10-CM | POA: Diagnosis not present

## 2023-01-15 DIAGNOSIS — Z8249 Family history of ischemic heart disease and other diseases of the circulatory system: Secondary | ICD-10-CM | POA: Diagnosis not present

## 2023-01-15 DIAGNOSIS — E860 Dehydration: Secondary | ICD-10-CM | POA: Diagnosis present

## 2023-01-15 DIAGNOSIS — N1832 Chronic kidney disease, stage 3b: Secondary | ICD-10-CM | POA: Diagnosis present

## 2023-01-15 DIAGNOSIS — E871 Hypo-osmolality and hyponatremia: Secondary | ICD-10-CM | POA: Diagnosis present

## 2023-01-15 DIAGNOSIS — E785 Hyperlipidemia, unspecified: Secondary | ICD-10-CM | POA: Diagnosis present

## 2023-01-15 DIAGNOSIS — Z681 Body mass index (BMI) 19 or less, adult: Secondary | ICD-10-CM | POA: Diagnosis not present

## 2023-01-15 DIAGNOSIS — E1122 Type 2 diabetes mellitus with diabetic chronic kidney disease: Secondary | ICD-10-CM | POA: Diagnosis present

## 2023-01-15 DIAGNOSIS — R739 Hyperglycemia, unspecified: Secondary | ICD-10-CM | POA: Diagnosis present

## 2023-01-15 DIAGNOSIS — Z7984 Long term (current) use of oral hypoglycemic drugs: Secondary | ICD-10-CM | POA: Diagnosis not present

## 2023-01-15 DIAGNOSIS — F1410 Cocaine abuse, uncomplicated: Secondary | ICD-10-CM | POA: Diagnosis present

## 2023-01-15 DIAGNOSIS — N179 Acute kidney failure, unspecified: Secondary | ICD-10-CM | POA: Diagnosis present

## 2023-01-15 LAB — LIPID PANEL
Cholesterol: 105 mg/dL (ref 0–200)
HDL: 45 mg/dL (ref >40)
LDL Cholesterol: 43 mg/dL (ref 0–99)
Total CHOL/HDL Ratio: 2.3 RATIO
Triglycerides: 85 mg/dL (ref <150)
VLDL: 17 mg/dL (ref 0–40)

## 2023-01-15 LAB — BASIC METABOLIC PANEL
Anion gap: 8 (ref 5–15)
BUN: 39 mg/dL — ABNORMAL HIGH (ref 8–23)
CO2: 24 mmol/L (ref 22–32)
Calcium: 8.5 mg/dL — ABNORMAL LOW (ref 8.9–10.3)
Chloride: 101 mmol/L (ref 98–111)
Creatinine, Ser: 2.24 mg/dL — ABNORMAL HIGH (ref 0.61–1.24)
GFR, Estimated: 31 mL/min — ABNORMAL LOW (ref >60)
Glucose, Bld: 253 mg/dL — ABNORMAL HIGH (ref 70–99)
Potassium: 3.9 mmol/L (ref 3.5–5.1)
Sodium: 133 mmol/L — ABNORMAL LOW (ref 135–145)

## 2023-01-15 LAB — GLUCOSE, CAPILLARY
Glucose-Capillary: 242 mg/dL — ABNORMAL HIGH (ref 70–99)
Glucose-Capillary: 182 mg/dL — ABNORMAL HIGH (ref 70–99)
Glucose-Capillary: 175 mg/dL — ABNORMAL HIGH (ref 70–99)
Glucose-Capillary: 511 mg/dL (ref 70–99)

## 2023-01-15 MED ORDER — INSULIN DETEMIR 100 UNIT/ML ~~LOC~~ SOLN
15.0000 [IU] | Freq: Two times a day (BID) | SUBCUTANEOUS | Status: DC
  Administered 2023-01-15 – 2023-01-17 (×5): 15 [IU] via SUBCUTANEOUS
  Filled 2023-01-15 (×6): qty 0.15

## 2023-01-15 MED ORDER — INSULIN ASPART 100 UNIT/ML IJ SOLN
2.0000 [IU] | Freq: Once | INTRAMUSCULAR | Status: AC
  Administered 2023-01-15: 2 [IU] via SUBCUTANEOUS

## 2023-01-15 MED ORDER — GLUCERNA SHAKE PO LIQD
237.0000 mL | Freq: Two times a day (BID) | ORAL | Status: DC
  Administered 2023-01-15 – 2023-01-16 (×3): 237 mL via ORAL
  Filled 2023-01-15 (×3): qty 237

## 2023-01-15 MED ORDER — ADULT MULTIVITAMIN W/MINERALS CH
1.0000 | ORAL_TABLET | Freq: Every day | ORAL | Status: DC
  Administered 2023-01-15 – 2023-01-17 (×3): 1 via ORAL
  Filled 2023-01-15 (×3): qty 1

## 2023-01-15 MED ORDER — PROSOURCE PLUS PO LIQD
30.0000 mL | Freq: Every day | ORAL | Status: DC
  Administered 2023-01-15 – 2023-01-16 (×2): 30 mL via ORAL
  Filled 2023-01-15: qty 30

## 2023-01-15 NOTE — Discharge Instructions (Signed)

## 2023-01-15 NOTE — Progress Notes (Signed)
PROGRESS NOTE  Dominic Molina  [id number] DOB: [birth year] DOA: 01/13/2023 PCP: Patient, No Pcp Per   Brief Narrative: Patient is a 67 year old male with history of diabetes type 2, hyperlipidemia, tobacco use/polysubstance abuse using cocaine who presents with blurry vision, dizziness, polyuria, polydipsia, weight loss of 35 pounds in last month.  On presentation ,he was hypertensive.  Urinalysis showed glucosuria, ketonuria.  Blood work showed sodium of 117, potassium of 5.5, glucose of 117, creatinine of 3.34, anion gap of 15.  Patient was admitted for management of diabetic hyperosmolar nonketotic state, insulin drip was started.  Diabetic coordinator consulted.  Planning to follow-up on hemoglobin A1c before dc  regarding the recommendation on diabetic medication which might include insulin before discharge.  Assessment & Plan:  Principal Problem:   Diabetic hyperosmolar non-ketotic state ([place]) Active Problems:   Hyperkalemia   Polysubstance abuse (HCC)   AKI (acute kidney injury) ([place])   Hyperlipidemia   Tobacco abuse   Unintentional weight loss   Hypernatremia   Oral candidiasis   Hyperglycemia   Diabetic hyperosmolar nonketotic state: Presented with severe hyperglycemia and symptoms of blurry vision, dizziness, polyuria, polydipsia, weight loss. Glucose more than 1000 on presentation.  Takes metformin only at home and patient is noncompliant.  Patient was started on insulin drip. Blood sugars have been improved, insulin drip stopped, currently on long-acting and sliding scale.  Diabetic coordinator following.  Hemoglobin A1c pending. Patient may need insulin on discharge, will continue to monitor blood sugars and wait for diabetic monitor recommendation on diabetic medication regimen.  AKI on CKD: On reviewing his previous lab works, his creatinine has fluctuated from 1.4-3.  Unclear baseline creatinine.  Presented with creatinine in the range of 3.  AKI was associated with  volume loss secondary to polyuria.  Kidney function improved with IV fluids.  Continue to monitor kidney function.  He needs follow-up with nephrology as an outpatient.  Hyperosmolar hyponatremia: Secondary to severe hyperglycemia.  Already corrected  Hypokalemia: Supplemented and corrected  Hyperlipidemia: Noncompliance.  Currently not on medical therapy.  Normal LDL  Tobacco use/cocaine abuse: Smokes, takes cocaine.  Declined nicotine patch.  Cessation advised  Oral candidiasis: Started on nystatin swish and swallow.  HIV pending  Unintentional weight loss: Likely from dehydration/volume loss from polyuria.  BMI of 15.6.Consulted nutritionist   Nutrition Problem: Increased nutrient needs Etiology: acute illness    DVT prophylaxis:enoxaparin (LOVENOX) injection 30 mg Start: 01/13/23 2200     Code Status: Full Code  Family Communication: Called and discussed with niece on phone today  Patient status:Inpatient  Patient is from :home  Anticipated discharge [id number]  Estimated DC date:tomorrow   Consultants: None  Procedures:None  Antimicrobials:  Anti-infectives (From admission, onward)    None       Subjective: Patient seen and examined at bedside today.  Hemodynamically stable very comfortable without any new complaints.  We discussed about continuing to monitor the blood sugars and adjust insulin dose, getting hemoglobin A1c level before discharge  Objective: Vitals:   01/14/23 1740 01/14/23 2038 01/15/23 0103 01/15/23 0427  BP: 136/84 (!) 150/92 123/86 (!) 153/98  Pulse: 93 74 85 76  Resp: 16 18 18 16   Temp: 98.6 F (37 C) 98.1 F (36.7 C) 98.2 F (36.8 C) (!) 97.4 F (36.3 C)  TempSrc: Oral Oral Oral Oral  SpO2: 95% 97% 100% 97%  Weight:      Height:        Intake/Output Summary (Last 24 hours) at 01/15/2023 1126  Last data filed at 01/15/2023 1000 Gross per 24 hour  Intake 1517.01 ml  Output 1050 ml  Net 467.01 ml   Filed Weights   01/13/23  1015 01/13/23 1431 01/14/23 0400  Weight: 59 kg 53.1 kg 54 kg    Examination:  General exam: Overall comfortable, not in distress,thin built HEENT: PERRL Respiratory system:  no wheezes or crackles  Cardiovascular system: S1 & S2 heard, RRR.  Gastrointestinal system: Abdomen is nondistended, soft and nontender. Central nervous system: Alert and oriented Extremities: No edema, no clubbing ,no cyanosis Skin: No rashes, no ulcers,no icterus     Data Reviewed: I have personally reviewed following labs and imaging studies  CBC: Recent Labs  Lab 01/13/23 1032 01/14/23 0530  WBC 9.1 6.9  NEUTROABS 8.1*  --   HGB 16.8 13.6  HCT 48.9 38.0*  MCV 74.0* 70.9*  PLT 213 [id number]   Basic Metabolic Panel: Recent Labs  Lab 01/13/23 1658 01/13/23 2034 01/14/23 0113 01/14/23 0530 01/15/23 0424  NA 129* 132* 136 133* 133*  K 4.4 3.6 3.4* 3.2* 3.9  CL 94* 98 100 101 101  CO2 21* 22 27 25 24   GLUCOSE 484* 173* 157* 158* 253*  BUN 44* 43* 42* 39* 39*  CREATININE 2.70* 2.56* 2.39* 2.15* 2.24*  CALCIUM 9.0 8.9 9.0 8.6* 8.5*     Recent Results (from the past 240 hour(s))  MRSA Next Gen by PCR, Nasal     Status: None   Collection Time: 01/13/23  2:30 PM   Specimen: Nasal Mucosa; Nasal Swab  Result Value Ref Range Status   MRSA by PCR Next Gen NOT DETECTED NOT DETECTED Final    Comment: (NOTE) The GeneXpert MRSA Assay (FDA approved for NASAL specimens only), is one component of a comprehensive MRSA colonization surveillance program. It is not intended to diagnose MRSA infection nor to guide or monitor treatment for MRSA infections. Test performance is not FDA approved in patients less than 57 years old. Performed at [facility], [place] [address], [place],  [address]      Radiology Studies: No results found.  Scheduled Meds:  (feeding supplement) PROSource Plus  30 mL Oral Daily   amLODipine  10 mg Oral Daily   Chlorhexidine Gluconate Cloth  6 each  Topical Daily   enoxaparin (LOVENOX) injection  30 mg Subcutaneous Q24H   feeding supplement (GLUCERNA SHAKE)  237 mL Oral BID BM   hydrALAZINE  25 mg Oral Q8H   insulin aspart  0-15 Units Subcutaneous TID WC   insulin aspart  0-5 Units Subcutaneous QHS   insulin detemir  15 Units Subcutaneous Q12H   multivitamin with minerals  1 tablet Oral Daily   nystatin  5 mL Oral QID   Continuous Infusions:  lactated ringers Stopped (01/13/23 1932)     LOS: 0 days   Shelly Coss, MD [facility] P3/24/2024, 11:26 AM

## 2023-01-15 NOTE — Progress Notes (Signed)
Initial Nutrition Assessment RD working remotely.   DOCUMENTATION CODES:   Not applicable  INTERVENTION:  - ordered Glucerna Shake BID, each supplement provides 220 kcal and 10 grams of protein.  - ordered 30 ml Prosource Plus BID, each supplement provides 100 kcal and 15 grams protein.   - ordered 1 tablet multivitamin with minerals/day.  - complete NFPE when feasible.  - entered Carbohydrate Counting for People with Diabetes handout from the Academy of [employer] Dietetics into the AVS.   - entered referral for outpatient DM-related diet education at [facility].   NUTRITION DIAGNOSIS:   Increased nutrient needs related to acute illness as evidenced by estimated needs.  GOAL:   Patient will meet greater than or equal to 90% of their needs  MONITOR:   PO intake, Supplement acceptance, Labs, Weight trends  REASON FOR ASSESSMENT:   Malnutrition Screening Tool, Consult Assessment of nutrition requirement/status  ASSESSMENT:   67 year old male with medical history of type 2 DM, HLD, tobacco use/polysubstance abuse using cocaine. He presented to the ED with blurry vision, dizziness, polyuria, polydipsia, weight loss of 35 pounds in the last month. On presentation, he was hypertensive.  Urinalysis showed glucosuria, ketonuria. Serum Na in the ED was 117 mmol/l. Patient was admitted for management of diabetic hyperosmolar nonketotic state, insulin drip was started. Diabetic coordinator consulted. Planning to follow-up on hemoglobin A1c regarding the recommendation on diabetic medication which might include insulin before discharge.  Diet advanced from NPO to Carb Modified, thin liquids yesterday at 1020 and patient ate 100% of dinner last night.   He has not been assessed by a [place] RD at any time in the past.   Weight yesterday was 119 lb and weight on 3/22 was 117 lb. PTA the most recently documented weight was 160 lb on 04/08/20 at [place]. This would  indicate 41 lb / 26% wt loss in ~3 years. The only other weight documented is 143 lb on 12/17/16 within [facility]. No information documented in the edema section of flow sheet.  Per notes: - diabetic hyperosmolar non-ketotic state - AKI on CKD - hyperosmolar hyponatremia - hypokalemia on presentation -- resolved - oral candidiasis  - HIV result pending   Labs reviewed; CBG: 242 mg/dl, Na: 133 mmol/l, BUN: 39 mg/dl, creatinine: 2.24 mg/dl, Ca: 8.5 mg/dl, GFR: 31 ml/min.  Medications reviewed; sliding scale novolog, 15 units levemir BID, 5 ml mycostatin QID.  IVF; LR @ 125 ml/hr.     NUTRITION - FOCUSED PHYSICAL EXAM:  RD working remotely.  Diet Order:   Diet Order             Diet Carb Modified Fluid consistency: Thin; Room service appropriate? Yes  Diet effective now                   EDUCATION NEEDS:   Education needs have been addressed  Skin:  Skin Assessment: Reviewed RN Assessment  Last BM:  PTA/unknown  Height:   Ht Readings from Last 1 Encounters:  01/13/23 5\' 7"  (1.702 m)    Weight:   Wt Readings from Last 1 Encounters:  01/14/23 54 kg    BMI:  Body mass index is 18.65 kg/m.  Estimated Nutritional Needs:  Kcal:  1750-1950 kcal Protein:  85-100 grams Fluid:  >/= 2 L/day     Jarome Matin, MS, RD, LDN, CNSC Clinical Dietitian PRN/Relief staff On-call/weekend pager # available in [facility]

## 2023-01-15 NOTE — Inpatient Diabetes Management (Incomplete)
Inpatient Diabetes Program Recommendations  AACE/ADA: New Consensus Statement on Inpatient Glycemic Control (2015)  Target Ranges:  Prepandial:   less than 140 mg/dL      Peak postprandial:   less than 180 mg/dL (1-2 hours)      Critically ill patients:  140 - 180 mg/dL   Lab Results  Component Value Date   GLUCAP 242 (H) 01/15/2023   HGBA1C 6.1 (H) 12/17/2016    Review of Glycemic Control  Latest Reference Range & Units 01/14/23 16:24 01/14/23 20:35 01/15/23 07:50  Glucose-Capillary 70 - 99 mg/dL 317 (H) 343 (H) 242 (H)  (H): Data is abnormally high Diabetes history: DM 2 (diagnosed 2-3 years ago placed on metformin) Outpatient Diabetes medications: metformin Current orders for Inpatient glycemic control: Levemir 15 units BID, Novolog 0-15 units TID & HS   A1C in process   Inpatient Diabetes Program Recommendations:

## 2023-01-15 NOTE — Inpatient Diabetes Management (Signed)
Inpatient Diabetes Program Recommendations  AACE/ADA: New Consensus Statement on Inpatient Glycemic Control (2015)  Target Ranges:  Prepandial:   less than 140 mg/dL      Peak postprandial:   less than 180 mg/dL (1-2 hours)      Critically ill patients:  140 - 180 mg/dL   Lab Results  Component Value Date   GLUCAP 242 (H) 01/15/2023   HGBA1C 6.1 (H) 12/17/2016    Review of Glycemic Control  Diabetes history: DM 2 (diagnosed 2-3 years ago placed on metformin) Outpatient Diabetes medications: metformin Current orders for Inpatient glycemic control: Iv insulin to Levemir 5 units BID, Novolog 0-9 units TID & HS   A1C in process   Inpatient Diabetes Program Recommendations:     Spoke with patient again to reinforce concepts discussed on 3/22.  Reviewed patho of DM, need for insulin, target goals, survival skills, interventions, including when t call MD, hypoglycemia, vascular changes and commorbidities.  Patient will need a glucose meter at discharge. Encouraged to make PCP appointment tomorrow.  Reviewed importance of being mindful of CHO intake and reviewed foods that were higher in CHO. Briefly discussed plate method, and importance of protein.  Patient plans to practice self injection prior to DC.  D/c needs: Glucose meter kit Insulin pen needles  Basal insulin covered by insurance  Thanks, Bronson Curb, MSN, RNC-OB Diabetes Coordinator [phone] (8a-5p)

## 2023-01-15 NOTE — Progress Notes (Signed)
Pt's CBG 511 at bedtime. Notified to oncall NP, ordered 2 units of additional Insulin Aspart.  Administered  insuline per ordered.

## 2023-01-16 ENCOUNTER — Other Ambulatory Visit (HOSPITAL_COMMUNITY): Payer: Self-pay

## 2023-01-16 DIAGNOSIS — E1100 Type 2 diabetes mellitus with hyperosmolarity without nonketotic hyperglycemic-hyperosmolar coma (NKHHC): Secondary | ICD-10-CM | POA: Diagnosis not present

## 2023-01-16 DIAGNOSIS — E43 Unspecified severe protein-calorie malnutrition: Secondary | ICD-10-CM | POA: Insufficient documentation

## 2023-01-16 LAB — BASIC METABOLIC PANEL
Anion gap: 8 (ref 5–15)
BUN: 34 mg/dL — ABNORMAL HIGH (ref 8–23)
CO2: 26 mmol/L (ref 22–32)
Calcium: 8.4 mg/dL — ABNORMAL LOW (ref 8.9–10.3)
Chloride: 101 mmol/L (ref 98–111)
Creatinine, Ser: 1.91 mg/dL — ABNORMAL HIGH (ref 0.61–1.24)
GFR, Estimated: 38 mL/min — ABNORMAL LOW (ref >60)
Glucose, Bld: 121 mg/dL — ABNORMAL HIGH (ref 70–99)
Potassium: 3.5 mmol/L (ref 3.5–5.1)
Sodium: 135 mmol/L (ref 135–145)

## 2023-01-16 LAB — GLUCOSE, CAPILLARY
Glucose-Capillary: 166 mg/dL — ABNORMAL HIGH (ref 70–99)
Glucose-Capillary: 374 mg/dL — ABNORMAL HIGH (ref 70–99)
Glucose-Capillary: 164 mg/dL — ABNORMAL HIGH (ref 70–99)
Glucose-Capillary: 283 mg/dL — ABNORMAL HIGH (ref 70–99)

## 2023-01-16 LAB — HEMOGLOBIN A1C
Hgb A1c MFr Bld: >15.5 % — ABNORMAL HIGH (ref 4.8–5.6)
Hgb A1c MFr Bld: >15.5 % — ABNORMAL HIGH (ref 4.8–5.6)
Mean Plasma Glucose: >398 mg/dL
Mean Plasma Glucose: >398 mg/dL

## 2023-01-16 MED ORDER — LOSARTAN POTASSIUM 50 MG PO TABS
100.0000 mg | ORAL_TABLET | Freq: Every day | ORAL | Status: DC
  Administered 2023-01-16 – 2023-01-17 (×2): 100 mg via ORAL
  Filled 2023-01-16 (×2): qty 2

## 2023-01-16 MED ORDER — GLUCERNA SHAKE PO LIQD
237.0000 mL | Freq: Three times a day (TID) | ORAL | Status: DC
  Administered 2023-01-16 – 2023-01-17 (×2): 237 mL via ORAL
  Filled 2023-01-16 (×4): qty 237

## 2023-01-16 MED ORDER — INSULIN ASPART 100 UNIT/ML IJ SOLN
4.0000 [IU] | Freq: Three times a day (TID) | INTRAMUSCULAR | Status: DC
  Administered 2023-01-16 – 2023-01-17 (×3): 4 [IU] via SUBCUTANEOUS

## 2023-01-16 NOTE — TOC Benefit Eligibility Note (Signed)
Patient Teacher, English as a foreign language completed.    The patient is currently admitted and upon discharge could be taking Lantus Solostar.  The current 30 day co-pay is $0.00.     Patient Teacher, English as a foreign language completed.    The patient is currently admitted and upon discharge could be taking Tou[place]jo Max Engineer, civil (consulting).  The current 30 day co-pay is $0.00.   The patient is insured through [facility] Part D   This test claim was processed through [facility] amounts may vary at other pharmacies due to pharmacy/plan contracts, or as the patient moves through the different stages of their insurance plan.

## 2023-01-16 NOTE — Progress Notes (Signed)
Nutrition Brief Note  Intervention RD adding double protein portions to all meals, Glucerna TID, HS snack   Assessment Visited patient at bedside to complete NFPE and conduct in-person interview. Patient's full assessment can be found in note published on 3/24.   Patient reports his appetite has been insatiable and would eat well each day and still be insatiable after eating a meal like 2 quarter pounders from [employer]. Patient states his meals are not filling him here and he often snacks on crackers and other items available on the floor between meals which could cause his CBG's to increase even more.   Patient reports he did not notice his weight loss at home    Nutrition Focused Physical Exam:  Subcutaneous Fat:  Orbital Region: moderate  Upper Arm Region: mild Thoracic and Lumbar Region: severe  Muscle:  Temple Region: severe Clavicle Bone Region: severe Clavicle and Acromion Bone Region: severe Scapular Bone Region: severe Dorsal Hand: severe Patellar Region: severe Anterior Thigh Region: severe Posterior Calf Region: severe   Edema: none    Trey Paula, RDN, LDN  Clinical Nutrition

## 2023-01-16 NOTE — Progress Notes (Signed)
Chaplain engaged in an initial visit with "Dominic Molina." [place]ron worked to honor consult for an Scientist, physiological, Event organiser. Chaplain provided document and education. Dominic Molina does not know who he would like to assign as his healthcare agents at this time. Chaplain let him know how to get the document done outside of the hospital. Chaplain provided support and compassionate presence.  Mateusz Neilan, MDiv  01/16/23 1000  Spiritual Encounters  Type of Visit Initial  Care provided to: Patient  Reason for visit Advance directives

## 2023-01-16 NOTE — Progress Notes (Signed)
PROGRESS NOTE  HOBERT SCHAUM  [id number] DOB: 08-31-1956 DOA: 01/13/2023 PCP: Patient, No Pcp Per   Brief Narrative: Patient is a 67 year old male with history of diabetes type 2, hyperlipidemia, tobacco use/polysubstance abuse using cocaine who presents with blurry vision, dizziness, polyuria, polydipsia, weight loss of 35 pounds in last month.  On presentation ,he was hypertensive.  Urinalysis showed glucosuria, ketonuria.  Blood work showed sodium of 117, potassium of 5.5, glucose of 117, creatinine of 3.34, anion gap of 15.  Patient was admitted for management of diabetic hyperosmolar nonketotic state, insulin drip was started.  Diabetic coordinator consulted.  Planning to follow-up on hemoglobin A1c before dc  regarding the recommendation on diabetic medication which might include insulin before discharge.  Assessment & Plan:  Principal Problem:   Diabetic hyperosmolar non-ketotic state ([place]) Active Problems:   Hyperkalemia   Polysubstance abuse (HCC)   AKI (acute kidney injury) ([place])   Hyperlipidemia   Tobacco abuse   Unintentional weight loss   Hypernatremia   Oral candidiasis   Hyperglycemia   Diabetic hyperosmolar nonketotic state: Presented with severe hyperglycemia and symptoms of blurry vision, dizziness, polyuria, polydipsia, weight loss. Glucose more than 1000 on presentation.  Takes metformin only at home and patient is noncompliant.  Patient was started on insulin drip. Blood sugars have been improved, insulin drip stopped, currently on long-acting and sliding scale.  Diabetic coordinator following.  Hemoglobin A1c pending. Patient may need insulin on discharge, will continue to monitor blood sugars and wait for diabetic monitor recommendation on diabetic medication regimen.  AKI on CKD: On reviewing his previous lab works, his creatinine has fluctuated from 1.4-3.  Unclear baseline creatinine.  Presented with creatinine in the range of 3.  AKI was associated with  volume loss secondary to polyuria.  Kidney function improved with IV fluids.  Continue to monitor kidney function.  He needs follow-up with nephrology as an outpatient.  Hypertension: Blood pressure high.  Continue on amlodipine 10 mg daily.  Takes losartan-hydrochlorothiazide at home.  Losartan 100 mg restarted.  Hyperosmolar hyponatremia: Secondary to severe hyperglycemia.  Already corrected  History of hyperlipidemia: Takes Lipitor.  Lipid panel shows normal LDL  Hypokalemia: Supplemented and corrected  Tobacco use/cocaine abuse: Smokes, takes cocaine.  Declined nicotine patch.  Cessation advised  Oral candidiasis: Started on nystatin swish and swallow.  HIV pending  Blurry vision: Complain of seeing flashes of light in bilateral eyes.  History of cataracts.  On examination, he would count my fingers on several occasions in varying distance.  We recommend follow-up with ophthalmology outpatient.  Vision impairment could also be from hyperglycemia, diabetic retinopathy  Unintentional weight loss: Likely from dehydration/volume loss from polyuria.  BMI of 15.6.Consulted nutritionist   Nutrition Problem: Severe Malnutrition Etiology: social / environmental circumstances    DVT prophylaxis:enoxaparin (LOVENOX) injection 30 mg Start: 01/13/23 2200     Code Status: Full Code  Family Communication: Called and discussed with niece on phone on 3/23  Patient status:Inpatient  Patient is from :home  Anticipated discharge [id number]  Estimated DC date:tomorrow, waiting for A1c level, diabetic coordinator recommendation before discharge   Consultants: None  Procedures:None  Antimicrobials:  Anti-infectives (From admission, onward)    None       Subjective: Patient seen and examined at bedside today.  Hemodynamically stable.  Feels comfortable.  He complains of seeing some flashes of light on bilateral eyes.  He has history of cataracts.  We discussed about the importance of  seeing ophthalmology as an  outpatient.,and also mentioned that  visual problem could be associated with high blood sugar. Objective: Vitals:   01/15/23 0427 01/15/23 1347 01/15/23 2210 01/16/23 0437  BP: (!) 153/98 (!) 153/102 (!) 166/103 (!) 170/96  Pulse: 76 86 81 75  Resp: 16 14 16 16   Temp: (!) 97.4 F (36.3 C) (!) 97.5 F (36.4 C) 98.2 F (36.8 C) 98 F (36.7 C)  TempSrc: Oral Oral Oral Oral  SpO2: 97% 98% 99% 99%  Weight:      Height:        Intake/Output Summary (Last 24 hours) at 01/16/2023 1251 Last data filed at 01/16/2023 1000 Gross per 24 hour  Intake 1388 ml  Output 850 ml  Net 538 ml   Filed Weights   01/13/23 1015 01/13/23 1431 01/14/23 0400  Weight: 59 kg 53.1 kg 54 kg    Examination:  General exam: Overall comfortable, not in distress,thin built HEENT: PERRL Respiratory system:  no wheezes or crackles  Cardiovascular system: S1 & S2 heard, RRR.  Gastrointestinal system: Abdomen is nondistended, soft and nontender. Central nervous system: Alert and oriented Extremities: No edema, no clubbing ,no cyanosis Skin: No rashes, no ulcers,no icterus     Data Reviewed: I have personally reviewed following labs and imaging studies  CBC: Recent Labs  Lab 01/13/23 1032 01/14/23 0530  WBC 9.1 6.9  NEUTROABS 8.1*  --   HGB 16.8 13.6  HCT 48.9 38.0*  MCV 74.0* 70.9*  PLT 213 [id number]   Basic Metabolic Panel: Recent Labs  Lab 01/13/23 2034 01/14/23 0113 01/14/23 0530 01/15/23 0424 01/16/23 0431  NA 132* 136 133* 133* 135  K 3.6 3.4* 3.2* 3.9 3.5  CL 98 100 101 101 101  CO2 22 27 25 24 26   GLUCOSE 173* 157* 158* 253* 121*  BUN 43* 42* 39* 39* 34*  CREATININE 2.56* 2.39* 2.15* 2.24* 1.91*  CALCIUM 8.9 9.0 8.6* 8.5* 8.4*     Recent Results (from the past 240 hour(s))  MRSA Next Gen by PCR, Nasal     Status: None   Collection Time: 01/13/23  2:30 PM   Specimen: Nasal Mucosa; Nasal Swab  Result Value Ref Range Status   MRSA by PCR Next Gen NOT  DETECTED NOT DETECTED Final    Comment: (NOTE) The GeneXpert MRSA Assay (FDA approved for NASAL specimens only), is one component of a comprehensive MRSA colonization surveillance program. It is not intended to diagnose MRSA infection nor to guide or monitor treatment for MRSA infections. Test performance is not FDA approved in patients less than 69 years old. Performed at [facility], [place] [address], [place], [place] [address]      Radiology Studies: No results found.  Scheduled Meds:  (feeding supplement) PROSource Plus  30 mL Oral Daily   amLODipine  10 mg Oral Daily   Chlorhexidine Gluconate Cloth  6 each Topical Daily   enoxaparin (LOVENOX) injection  30 mg Subcutaneous Q24H   feeding supplement (GLUCERNA SHAKE)  237 mL Oral TID BM   insulin aspart  0-15 Units Subcutaneous TID WC   insulin aspart  0-5 Units Subcutaneous QHS   insulin aspart  4 Units Subcutaneous TID WC   insulin detemir  15 Units Subcutaneous Q12H   losartan  100 mg Oral Daily   multivitamin with minerals  1 tablet Oral Daily   nystatin  5 mL Oral QID   Continuous Infusions:     LOS: 1 day   Shelly Coss, MD [facility]  P3/25/2024, 12:51 PM

## 2023-01-16 NOTE — TOC CM/SW Note (Signed)
Transition of Care [facility]) Screening Note  Patient Details  Name: Dominic Molina Date of Birth: 1956/05/04  Transition of [facility]) CM/SW Contact:    Sherie Don, LCSW Phone Number: 01/16/2023, 9:43 AM  Transition of Care Department [facility]) has reviewed patient and no TOC needs have been identified at this time. We will continue to monitor patient advancement through interdisciplinary progression rounds. If new patient transition needs arise, please place a TOC consult.

## 2023-01-17 DIAGNOSIS — E1100 Type 2 diabetes mellitus with hyperosmolarity without nonketotic hyperglycemic-hyperosmolar coma (NKHHC): Secondary | ICD-10-CM | POA: Diagnosis not present

## 2023-01-17 LAB — GLUCOSE, CAPILLARY
Glucose-Capillary: 112 mg/dL — ABNORMAL HIGH (ref 70–99)
Glucose-Capillary: 242 mg/dL — ABNORMAL HIGH (ref 70–99)

## 2023-01-17 MED ORDER — BLOOD GLUCOSE TEST VI STRP: 1.0000 | ORAL_STRIP | Freq: Three times a day (TID) | 1 refills | Status: AC

## 2023-01-17 MED ORDER — LOSARTAN POTASSIUM 100 MG PO TABS: 100.0000 mg | ORAL_TABLET | Freq: Every day | ORAL | 1 refills | Status: DC

## 2023-01-17 MED ORDER — HYDRALAZINE HCL 25 MG PO TABS
25.0000 mg | ORAL_TABLET | Freq: Three times a day (TID) | ORAL | Status: DC
  Administered 2023-01-17: 25 mg via ORAL
  Filled 2023-01-17: qty 1

## 2023-01-17 MED ORDER — BLOOD PRESSURE KIT: 1.0000 | PACK | Freq: Two times a day (BID) | 0 refills | Status: DC

## 2023-01-17 MED ORDER — HYDRALAZINE HCL 50 MG PO TABS: 50.0000 mg | ORAL_TABLET | Freq: Three times a day (TID) | ORAL | 1 refills | Status: DC

## 2023-01-17 MED ORDER — LANCET DEVICE MISC: 1.0000 | Freq: Three times a day (TID) | 0 refills | Status: AC

## 2023-01-17 MED ORDER — NOVOLOG FLEXPEN 100 UNIT/ML ~~LOC~~ SOPN: 4.0000 [IU] | PEN_INJECTOR | Freq: Three times a day (TID) | SUBCUTANEOUS | 2 refills | Status: DC

## 2023-01-17 MED ORDER — AMLODIPINE BESYLATE 10 MG PO TABS: 10.0000 mg | ORAL_TABLET | Freq: Every day | ORAL | 1 refills | Status: DC

## 2023-01-17 MED ORDER — BLOOD GLUCOSE MONITORING SUPPL DEVI: 1.0000 | Freq: Three times a day (TID) | 0 refills | Status: DC

## 2023-01-17 MED ORDER — "PEN NEEDLES 5/16"" 31G X 8 MM MISC": 1.0000 | Freq: Three times a day (TID) | 1 refills | Status: DC

## 2023-01-17 MED ORDER — INSULIN GLARGINE 100 UNIT/ML SOLOSTAR PEN: 30.0000 [IU] | PEN_INJECTOR | Freq: Every day | SUBCUTANEOUS | 1 refills | Status: DC

## 2023-01-17 MED ORDER — LANCETS MISC. MISC: 1.0000 | Freq: Three times a day (TID) | 0 refills | Status: AC

## 2023-01-17 NOTE — Inpatient Diabetes Management (Signed)
Inpatient Diabetes Program Recommendations  AACE/ADA: New Consensus Statement on Inpatient Glycemic Control (2015)  Target Ranges:  Prepandial:   less than 140 mg/dL      Peak postprandial:   less than 180 mg/dL (1-2 hours)      Critically ill patients:  140 - 180 mg/dL   Lab Results  Component Value Date   GLUCAP 112 (H) 01/17/2023   HGBA1C >15.5 (H) 01/15/2023    Review of Glycemic Control   Outpatient Diabetes medications: metformin Current orders for Inpatient glycemic control: Levemir 15 units BID, Novolog 0-9 units TID & HS, Novolog 4 units TID    Inpatient Diabetes Program Recommendations:     Spoke with patient again to reinforce concepts related to diabetes management.  Reviewed patient's current A1c of >15.5%. Explained what a A1c is and what it measures. Also reviewed goal A1c with patient, importance of good glucose control @ home, and blood sugar goals. Reviewed differences with long acting vs short acting insulin, current insulin dosages, recommendations for discharge, copay for basal insulin, survival skills, interventions and other commorbidities.  Patient will need a meter at discharge. Discussed when to check CBGs and follow up with Md.  No further questions at this time.   D/c needs: Glucose meter kit "[id number] Insulin pen needles "[id number]" Lantus 30 units QD- Solostar insulin pen "[id number]" Novolog 4 units TID- insulin pen "[id number]"   Thanks, Bronson Curb, MSN, RNC-OB Diabetes Coordinator [phone] (8a-5p)

## 2023-01-17 NOTE — Discharge Summary (Signed)
Physician Discharge Summary  [name] [id number] DOB: [birth year] DOA: 01/13/2023  PCP: Dominic Molina, No Pcp Per  Admit date: 01/13/2023 Discharge date: 01/17/2023  Admitted From: Home Disposition:  Home  Discharge Condition:Stable CODE STATUS:FULL Diet recommendation: Carb Modified  Brief/Interim Summary: Dominic Molina is a 67 year old male with history of diabetes type 2, hyperlipidemia, tobacco use/polysubstance abuse using cocaine who presents with blurry vision, dizziness, polyuria, polydipsia, weight loss of 35 pounds in last month. On presentation ,he was hypertensive. Urinalysis showed glucosuria, ketonuria. Blood work showed sodium of 117, potassium of 5.5, glucose of 117, creatinine of 3.34, anion gap of 15. Dominic Molina was admitted for management of diabetic hyperosmolar nonketotic state, insulin drip was started. Diabetic coordinator consulted.  A1c came to be more than 15.  Started on insulin.  Provided proper diabetic education, instruction on insulin use and monitoring blood sugars at home.  Medically stable for discharge home today.  Following problems were addressed during the hospitalization:  Diabetic hyperosmolar nonketotic state: Presented with severe hyperglycemia and symptoms of blurry vision, dizziness, polyuria, polydipsia, weight loss. Glucose more than 1000 on presentation.  Takes metformin only at home and Dominic Molina is noncompliant.  Dominic Molina was started on insulin drip. Blood sugars have been improved, insulin drip stopped, currently on long-acting and sliding scale.  Diabetic coordinator following.  Hemoglobin A1c came out to be more than 15. Provided proper diabetic education, instruction on insulin use and monitoring blood sugars at home.    AKI on CKD: On reviewing his previous lab works, his creatinine has fluctuated from 1.4-3.  Unclear baseline creatinine.  Presented with creatinine in the range of 3.  AKI was associated with volume loss secondary to polyuria.  Kidney  function improved with IV fluids.  He needs follow-up with nephrology as an outpatient.  Referral sent   Hypertension: Blood pressure high.  Continue on amlodipine 10 mg, losartan, hydralazine   Hyperosmolar hyponatremia: Secondary to severe hyperglycemia.  Already corrected   History of hyperlipidemia: Takes Lipitor.  Lipid panel shows normal LDL   Hypokalemia: Supplemented and corrected   Tobacco use/cocaine abuse: Smokes, takes cocaine.  Declined nicotine patch.  Cessation advised   Oral candidiasis: Started on nystatin swish and swallow.  Resolved   Blurry vision: Complain of seeing flashes of light in bilateral eyes.  History of cataracts.  On examination, he would count my fingers on several occasions in varying distance.  We recommend follow-up with ophthalmology outpatient.  Vision impairment could also be from hyperglycemia, diabetic retinopathy   Unintentional weight loss: Likely from dehydration/volume loss from polyuria.  BMI of 15.6.Consulted nutritionist     Discharge Diagnoses:  Principal Problem:   Diabetic hyperosmolar non-ketotic state ([place]) Active Problems:   Hyperkalemia   Polysubstance abuse ([place])   AKI (acute kidney injury) ([place])   Hyperlipidemia   Tobacco abuse   Unintentional weight loss   Hypernatremia   Oral candidiasis   Hyperglycemia   Protein-calorie malnutrition, severe    Discharge Instructions  Discharge Instructions     Amb Referral to Nutrition and Diabetic Education   Complete by: As directed    Ambulatory referral to Nephrology   Complete by: As directed    Diet Carb Modified   Complete by: As directed    Discharge instructions   Complete by: As directed    1)Please take prescribed medications as instructed 2)Follow up with your PCP in a week.  Do a BMP test during the follow-up to check your kidney function 3)Follow up with nephrology as  an outpatient.  Name and number the provider has been attached 4)Please monitor blood sugar  and blood pressure at home. 5)Follow up with ophthalmology as an outpatient.   Increase activity slowly   Complete by: As directed       Allergies as of 01/17/2023   No Known Allergies      Medication List     STOP taking these medications    losartan-hydrochlorothiazide 100-25 MG tablet Commonly known as: HYZAAR   metFORMIN 500 MG 24 hr tablet Commonly known as: GLUCOPHAGE-XR       TAKE these medications    amLODipine 10 MG tablet Commonly known as: NORVASC Take 1 tablet (10 mg total) by mouth daily. Start taking on: January 18, 2023 What changed:  medication strength how much to take   atorvastatin 20 MG tablet Commonly known as: LIPITOR Take 20 mg by mouth at bedtime.   Blood Glucose Monitoring Suppl Devi 1 each by Does not apply route in the morning, at noon, and at bedtime. May substitute to any manufacturer covered by Dominic Molina's insurance.   BLOOD GLUCOSE TEST STRIPS Strp 1 each by In Vitro route in the morning, at noon, and at bedtime. May substitute to any manufacturer covered by Dominic Molina's insurance.   Blood Pressure Kit 1 each by Does not apply route 2 (two) times daily.   hydrALAZINE 50 MG tablet Commonly known as: APRESOLINE Take 1 tablet (50 mg total) by mouth 3 (three) times daily.   insulin glargine 100 UNIT/ML Solostar Pen Commonly known as: LANTUS Inject 30 Units into the skin daily.   Lancet Device Misc 1 each by Does not apply route in the morning, at noon, and at bedtime. May substitute to any manufacturer covered by Dominic Molina's insurance.   Lancets Misc. Misc 1 each by Does not apply route in the morning, at noon, and at bedtime. May substitute to any manufacturer covered by Dominic Molina's insurance.   losartan 100 MG tablet Commonly known as: COZAAR Take 1 tablet (100 mg total) by mouth daily. Start taking on: January 18, 2023   NovoLOG FlexPen 100 UNIT/ML FlexPen Generic drug: insulin aspart Inject 4 Units into the skin 3 (three) times  daily with meals.   PEN NEEDLES 31GX5/16" 31G X 8 MM Misc 1 each by Does not apply route 3 (three) times daily.        Follow-up Information     Dwana Melena, MD. Schedule an appointment as soon as possible for a visit in 2 week(s).   Specialty: Nephrology Contact information: [place] [phone]                No Known Allergies  Consultations: None   Procedures/Studies: No results found.    Subjective: Dominic Molina seen and examined at bedside today.  Hemodynamically stable for discharge.  I called his niece to update about discharge planning,call not received  Discharge Exam: Vitals:   01/17/23 0615 01/17/23 1045  BP: (!) 158/117 (!) 167/103  Pulse: 75   Resp: 18   Temp: 97.6 F (36.4 C)   SpO2: 100%    Vitals:   01/16/23 2130 01/17/23 0600 01/17/23 0615 01/17/23 1045  BP: (!) 167/106 (!) 171/110 (!) 158/117 (!) 167/103  Pulse: 93 100 75   Resp: 16  18   Temp: 98.6 F (37 C)  97.6 F (36.4 C)   TempSrc: Oral  Oral   SpO2: 97%  100%   Weight:      Height:  General: Pt is alert, awake, not in acute distress Cardiovascular: RRR, S1/S2 +, no rubs, no gallops Respiratory: CTA bilaterally, no wheezing, no rhonchi Abdominal: Soft, NT, ND, bowel sounds + Extremities: no edema, no cyanosis    The results of significant diagnostics from this hospitalization (including imaging, microbiology, ancillary and laboratory) are listed below for reference.     Microbiology: Recent Results (from the past 240 hour(s))  MRSA Next Gen by PCR, Nasal     Status: None   Collection Time: 01/13/23  2:30 PM   Specimen: Nasal Mucosa; Nasal Swab  Result Value Ref Range Status   MRSA by PCR Next Gen NOT DETECTED NOT DETECTED Final    Comment: (NOTE) The GeneXpert MRSA Assay (FDA approved for NASAL specimens only), is one component of a comprehensive MRSA colonization surveillance program. It is not intended to diagnose MRSA  infection nor to guide or monitor treatment for MRSA infections. Test performance is not FDA approved in patients less than 71 years old. Performed at [facility], [place] [address], [place], [place] [address]      Labs: BNP (last 3 results) No results for input(s): "BNP" in the last 8760 hours. Basic Metabolic Panel: Recent Labs  Lab 01/13/23 2034 01/14/23 0113 01/14/23 0530 01/15/23 0424 01/16/23 0431  NA 132* 136 133* 133* 135  K 3.6 3.4* 3.2* 3.9 3.5  CL 98 100 101 101 101  CO2 22 27 25 24 26   GLUCOSE 173* 157* 158* 253* 121*  BUN 43* 42* 39* 39* 34*  CREATININE 2.56* 2.39* 2.15* 2.24* 1.91*  CALCIUM 8.9 9.0 8.6* 8.5* 8.4*   Liver Function Tests: Recent Labs  Lab 01/13/23 1052 01/14/23 0113  AST 26 28  ALT 31 26  ALKPHOS 110 90  BILITOT 1.0 1.2  PROT 7.5 6.4*  ALBUMIN 3.5 3.1*   Recent Labs  Lab 01/13/23 1052  LIPASE 37   No results for input(s): "AMMONIA" in the last 168 hours. CBC: Recent Labs  Lab 01/13/23 1032 01/14/23 0530  WBC 9.1 6.9  NEUTROABS 8.1*  --   HGB 16.8 13.6  HCT 48.9 38.0*  MCV 74.0* 70.9*  PLT 213 210   Cardiac Enzymes: No results for input(s): "CKTOTAL", "CKMB", "CKMBINDEX", "TROPONINI" in the last 168 hours. BNP: Invalid input(s): "POCBNP" CBG: Recent Labs  Lab 01/16/23 0732 01/16/23 1132 01/16/23 1628 01/16/23 2131 01/17/23 0727  GLUCAP 166* 374* 164* 283* 112*   D-Dimer No results for input(s): "DDIMER" in the last 72 hours. Hgb A1c Recent Labs    01/15/23 0844  HGBA1C >15.5*   Lipid Profile Recent Labs    01/15/23 0424  CHOL 105  HDL 45  LDLCALC 43  TRIG 85  CHOLHDL 2.3   Thyroid function studies No results for input(s): "TSH", "T4TOTAL", "T3FREE", "THYROIDAB" in the last 72 hours.  Invalid input(s): "FREET3" Anemia work up No results for input(s): "VITAMINB12", "FOLATE", "FERRITIN", "TIBC", "IRON", "RETICCTPCT" in the last 72 hours. Urinalysis    Component Value Date/Time    COLORURINE STRAW (A) 01/13/2023 1539   APPEARANCEUR CLEAR 01/13/2023 1539   LABSPEC 1.017 01/13/2023 1539   PHURINE 5.0 01/13/2023 1539   GLUCOSEU >=500 (A) 01/13/2023 1539   HGBUR MODERATE (A) 01/13/2023 1539   BILIRUBINUR NEGATIVE 01/13/2023 1539   KETONESUR 5 (A) 01/13/2023 1539   PROTEINUR NEGATIVE 01/13/2023 1539   NITRITE NEGATIVE 01/13/2023 1539   LEUKOCYTESUR NEGATIVE 01/13/2023 1539   Sepsis Labs Recent Labs  Lab 01/13/23 1032 01/14/23 0530  WBC 9.1  6.9   Microbiology Recent Results (from the past 240 hour(s))  MRSA Next Gen by PCR, Nasal     Status: None   Collection Time: 01/13/23  2:30 PM   Specimen: Nasal Mucosa; Nasal Swab  Result Value Ref Range Status   MRSA by PCR Next Gen NOT DETECTED NOT DETECTED Final    Comment: (NOTE) The GeneXpert MRSA Assay (FDA approved for NASAL specimens only), is one component of a comprehensive MRSA colonization surveillance program. It is not intended to diagnose MRSA infection nor to guide or monitor treatment for MRSA infections. Test performance is not FDA approved in patients less than 49 years old. Performed at [facility], [place] [address], [place], [place] [address]     Please note: You were cared for by a hospitalist during your hospital stay. Once you are discharged, your primary care physician will handle any further medical issues. Please note that NO REFILLS for any discharge medications will be authorized once you are discharged, as it is imperative that you return to your primary care physician (or establish a relationship with a primary care physician if you do not have one) for your post hospital discharge needs so that they can reassess your need for medications and monitor your lab values.    Time coordinating discharge: 40 minutes  SIGNED:   Shelly Coss, MD  [facility] 01/17/2023, 11:11 AM Pager [phone]  If 7PM-7AM, please contact  night-coverage [email or web address] Password [email or web address]

## 2023-01-17 NOTE — Progress Notes (Signed)
Discharge instructions given to patient and all questions were answered.  

## 2023-02-16 ENCOUNTER — Encounter: Payer: 59 | Attending: Internal Medicine | Admitting: Dietician

## 2023-02-16 ENCOUNTER — Encounter: Payer: Self-pay | Admitting: Dietician

## 2023-02-16 VITALS — Ht 67.0 in | Wt 132.0 lb

## 2023-02-16 DIAGNOSIS — E1169 Type 2 diabetes mellitus with other specified complication: Secondary | ICD-10-CM | POA: Insufficient documentation

## 2023-02-16 DIAGNOSIS — Z794 Long term (current) use of insulin: Secondary | ICD-10-CM | POA: Insufficient documentation

## 2023-02-16 NOTE — Progress Notes (Signed)
Diabetes Self-Management Education  Visit Type: First/Initial  Appt. Start Time: 0945 Appt. End Time: 1100  02/17/2023  Dominic Molina, identified by name and date of birth, is a 67 y.o. male with a diagnosis of Diabetes: Type 2.   ASSESSMENT Patient is here today alone.  Occasionally misses the Humalog. He does not have a cell phone and does not have a CGM. He is not checking his blood glucose now as he states that he is having problems reading the meter.  His eye doctor is waiting to refill his glasses prescription until his blood glucose is improved. Blood glucose in office today was 154. Tried oatmilk for the first time this week.  Referral diagnosis:  Diabetic hyperosmolar non-ketotic state.   History includes:  Type 2 diabetes (dx 2021 or 2022 on metformin), HLD, occasional smoking, History of heroin overdose (2018 and polysubstance abuse, AKI Medications include:  Lantus 30 units q am, Humalog   4 units before each meal. Labs noted to include:  >15.5% 01/15/2023,eGFR 38 01/16/2023 Sleeps only 3-4 hours per day (lifelong)  67" 132 lbs 02/16/2023 117 lbs 01/13/2023 (loss new to unknown diabetes) 147-150 lbs UBW 2023  Patient lives with his 2 older sisters.  He does his own shopping and cooking.  Eats a lot of fast food and does not like to cook. He is retired from [employer]. He has some food insecurity.  Had food stamps prior to social security.   He does not have a cell phone and does not have a CGM. Her sister's have diabetes and 2 are on insulin. Has a bike and has been riding this most days for 10 minutes.  He does lawn work for himself and others.   Height 5\' 7"  (1.702 m), weight 132 lb (59.9 kg). Body mass index is 20.67 kg/m.   Diabetes Self-Management Education - 02/16/23 1015       Visit Information   Visit Type First/Initial      Initial Visit   Diabetes Type Type 2    Date Diagnosed 2022    Are you currently following a meal plan? No    Are you  taking your medications as prescribed? Yes      Health Coping   How would you rate your overall health? Good      Psychosocial Assessment   Patient Belief/Attitude about Diabetes Defeat/Burnout    What is the hardest part about your diabetes right now, causing you the most concern, or is the most worrisome to you about your diabetes?   Checking blood sugar;Taking/obtaining medications    Self-care barriers None    Self-management support Doctor's office    Other persons present Patient    Patient Concerns Nutrition/Meal planning;Glycemic Control;Monitoring    Special Needs None    Preferred Learning Style No preference indicated    Learning Readiness Ready    How often do you need to have someone help you when you read instructions, pamphlets, or other written materials from your doctor or pharmacy? 3 - Sometimes    What is the last grade level you completed in school? 12      Pre-Education Assessment   Patient understands the diabetes disease and treatment process. Needs Instruction    Patient understands incorporating nutritional management into lifestyle. Needs Instruction    Patient undertands incorporating physical activity into lifestyle. Needs Instruction    Patient understands using medications safely. Needs Instruction    Patient understands monitoring blood glucose, interpreting and using results Needs  Instruction    Patient understands prevention, detection, and treatment of acute complications. Needs Instruction    Patient understands prevention, detection, and treatment of chronic complications. Needs Instruction    Patient understands how to develop strategies to address psychosocial issues. Needs Instruction    Patient understands how to develop strategies to promote health/change behavior. Needs Instruction      Complications   Last HgB A1C per patient/outside source 15.5 %   01/15/2023   How often do you check your blood sugar? 0 times/day (not testing)    Postprandial  Blood glucose range (mg/dL) 161-096    Number of hypoglycemic episodes per month 0    Have you had a dilated eye exam in the past 12 months? Yes    Have you had a dental exam in the past 12 months? Yes    Are you checking your feet? Yes    How many days per week are you checking your feet? 7      Dietary Intake   Breakfast [place], egg, and cheese McMuffin, OJ, coffee with powdered creamer and splenda    Snack (morning) only if skips breakfast    Lunch [employer] fish sandwich and fries    Snack (afternoon) chips or cheese doodles    Dinner chicken sandwich from [employer] (evening) homemade PB on saltines, 2 glasses apple juice OR whole sweet potato pie    Beverage(s) water, coffee with powdered creamer and splenda, OJ, other juice, sweet tea      Activity / Exercise   Activity / Exercise Type ADL's      Patient Education   Previous Diabetes Education No    Disease Pathophysiology Definition of diabetes, type 1 and 2, and the diagnosis of diabetes    Healthy Eating Role of diet in the treatment of diabetes and the relationship between the three main macronutrients and blood glucose level;Food label reading, portion sizes and measuring food.;Plate Method;Meal options for control of blood glucose level and chronic complications.;Effects of alcohol on blood glucose and safety factors with consumption of alcohol.;Meal timing in regards to the patients' current diabetes medication.    Being Active Role of exercise on diabetes management, blood pressure control and cardiac health.    Medications Reviewed patients medication for diabetes, action, purpose, timing of dose and side effects.;Taught/reviewed insulin/injectables, injection, site rotation, insulin/injectables storage and needle disposal.    Monitoring Purpose and frequency of SMBG.;Identified appropriate SMBG and/or A1C goals.;Daily foot exams;Yearly dilated eye exam    Acute complications Taught prevention, symptoms, and   treatment of hypoglycemia - the 15 rule.;Discussed and identified patients' prevention, symptoms, and treatment of hyperglycemia.    Chronic complications Relationship between chronic complications and blood glucose control    Diabetes Stress and Support Identified and addressed patients feelings and concerns about diabetes;Worked with patient to identify barriers to care and solutions;Role of stress on diabetes    Lifestyle and Health Coping Lifestyle issues that need to be addressed for better diabetes care      Post-Education Assessment   Patient understands the diabetes disease and treatment process. Comprehends key points    Patient understands incorporating nutritional management into lifestyle. Needs Review    Patient undertands incorporating physical activity into lifestyle. Comprehends key points    Patient understands using medications safely. Comphrehends key points    Patient understands monitoring blood glucose, interpreting and using results Comprehends key points    Patient understands prevention, detection, and treatment of acute complications. Comprehends key  points    Patient understands prevention, detection, and treatment of chronic complications. Comprehends key points    Patient understands how to develop strategies to address psychosocial issues. Needs Review    Patient understands how to develop strategies to promote health/change behavior. Needs Review      Outcomes   Expected Outcomes Demonstrated interest in learning. Expect positive outcomes    Future DMSE 2 months    Program Status Not Completed             Individualized Plan for Diabetes Self-Management Training:   Learning Objective:  Patient will have a greater understanding of diabetes self-management. Patient education plan is to attend individual and/or group sessions per assessed needs and concerns.   Plan:   Patient Instructions  Consider asking your doctor for a Dexcom G7 and receiver.  I can  train you to use this. Use Oatmilk or milk in your coffee rather than powdered creamer. Avoid any beverages with carbohydrates  Use splenda in your tea rather than sugar.  Choose "True lemon" products added to water  Great job on stopping the soda's and icy's drinks!! No caffeine in the afternoon  Keep a fast acting sugar in your car and in your pocket. Stay active most days:  walk, lawn care, bike  Goals for diet change:  Avoid drinks with sugar/carbohydrates Eat snacks that have nutrition Add vegetables Less fried Eat out less often  Expected Outcomes:  Demonstrated interest in learning. Expect positive outcomes  Education material provided: ADA - How to Thrive: A Guide for Your Journey with Diabetes, Food label handouts, Meal plan card, Snack sheet, and Diabetes Resources  If problems or questions, patient to contact team via:  Phone  Future DSME appointment: 2 months

## 2023-02-16 NOTE — Patient Instructions (Addendum)
Consider asking your doctor for a Dexcom G7 and receiver.  I can train you to use this. Use Oatmilk or milk in your coffee rather than powdered creamer. Avoid any beverages with carbohydrates  Use splenda in your tea rather than sugar.  Choose "True lemon" products added to water  Great job on stopping the soda's and icy's drinks!! No caffeine in the afternoon  Keep a fast acting sugar in your car and in your pocket. Stay active most days:  walk, lawn care, bike  Goals for diet change:  Avoid drinks with sugar/carbohydrates Eat snacks that have nutrition Add vegetables Less fried Eat out less often

## 2023-04-06 ENCOUNTER — Ambulatory Visit: Payer: 59 | Admitting: Dietician

## 2023-10-22 IMAGING — DX DG CHEST 2V
2 series · 2 of 2 positions shown · non-contrast
Comparison: 12/17/2016

CLINICAL DATA: Altered mental status

EXAM:
CHEST - 2 VIEW

[chest pa]
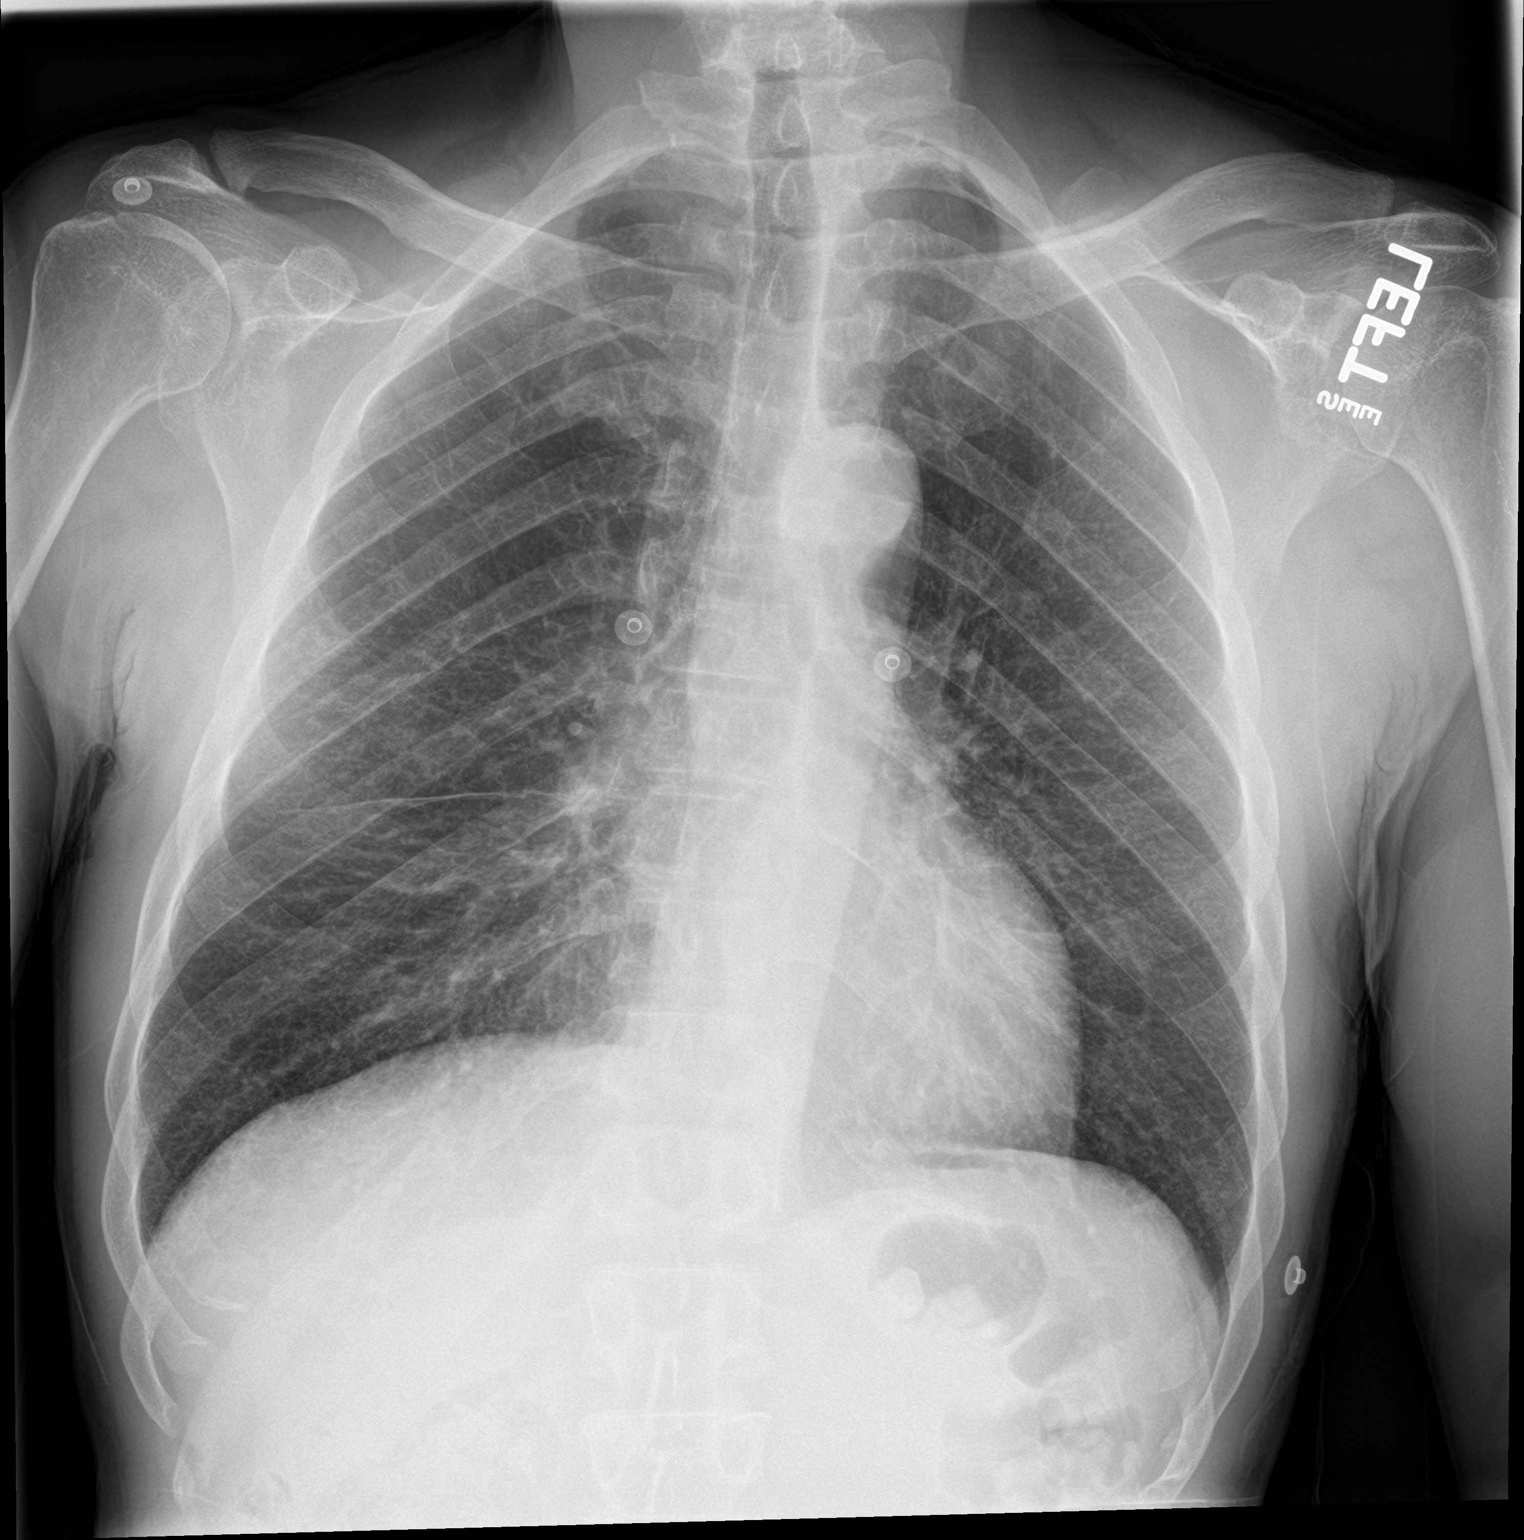

[chest lat]
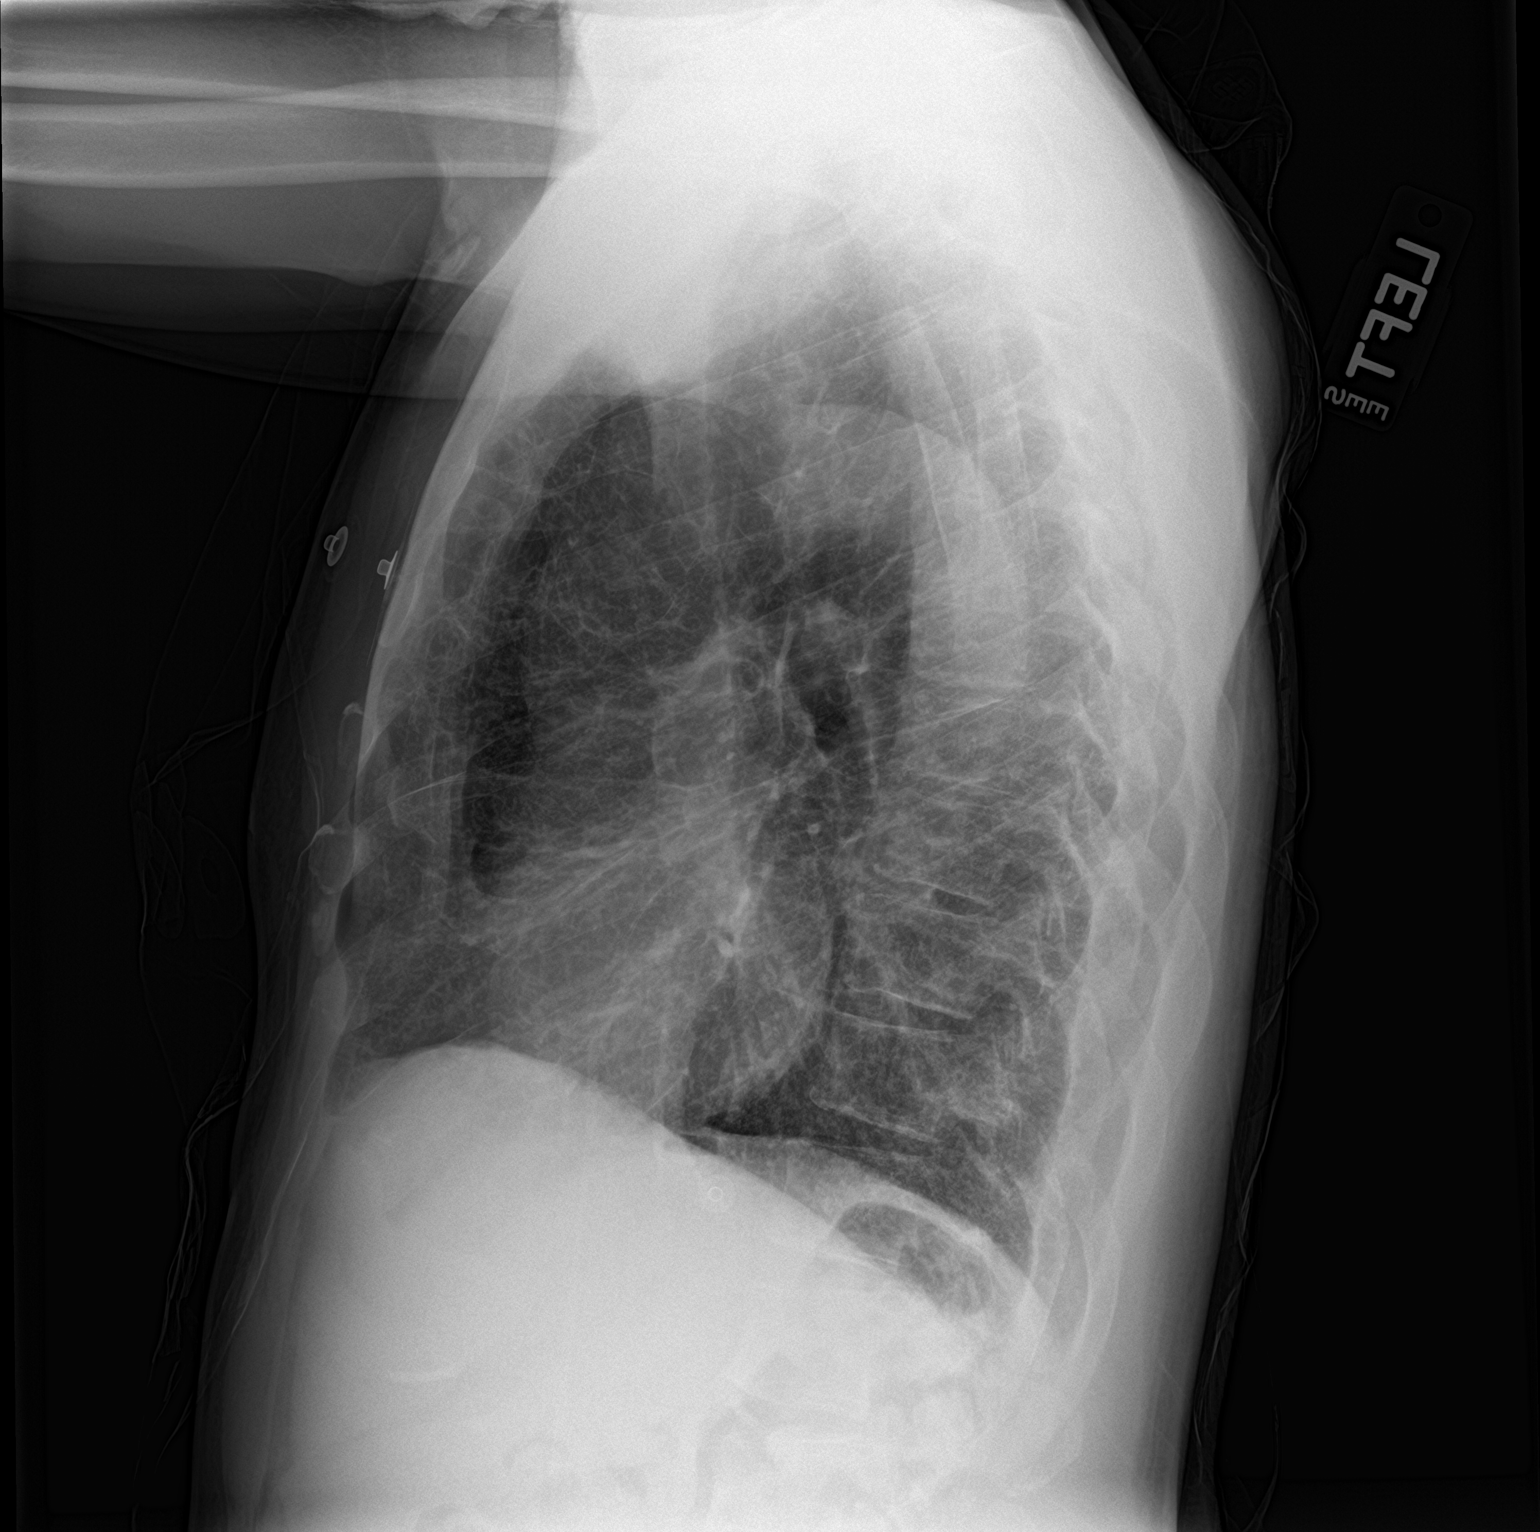

[2 of 2 positions shown; findings below may reference images not displayed]

FINDINGS: Cardiac shadow is within normal limits. The lungs are well aerated
bilaterally. Minimal right middle lobe atelectasis is seen. No focal
infiltrate or sizable effusion is noted. No bony abnormality is
noted.
IMPRESSION: Minimal right middle lobe atelectasis.

## 2024-01-30 ENCOUNTER — Inpatient Hospital Stay (HOSPITAL_COMMUNITY)
Admission: EM | Admit: 2024-01-30 | Discharge: 2024-02-02 | DRG: 638 | Disposition: A | Discharge status: Home / Self Care | Attending: Internal Medicine | Admitting: Internal Medicine

## 2024-01-30 ENCOUNTER — Emergency Department (HOSPITAL_COMMUNITY)

## 2024-01-30 ENCOUNTER — Other Ambulatory Visit: Payer: Self-pay

## 2024-01-30 ENCOUNTER — Encounter (HOSPITAL_COMMUNITY): Payer: Self-pay

## 2024-01-30 DIAGNOSIS — F17200 Nicotine dependence, unspecified, uncomplicated: Secondary | ICD-10-CM | POA: Diagnosis present

## 2024-01-30 DIAGNOSIS — E860 Dehydration: Secondary | ICD-10-CM | POA: Diagnosis present

## 2024-01-30 DIAGNOSIS — D72829 Elevated white blood cell count, unspecified: Secondary | ICD-10-CM | POA: Diagnosis present

## 2024-01-30 DIAGNOSIS — T383X6A Underdosing of insulin and oral hypoglycemic [antidiabetic] drugs, initial encounter: Secondary | ICD-10-CM | POA: Diagnosis present

## 2024-01-30 DIAGNOSIS — N179 Acute kidney failure, unspecified: Secondary | ICD-10-CM | POA: Diagnosis present

## 2024-01-30 DIAGNOSIS — E875 Hyperkalemia: Secondary | ICD-10-CM | POA: Diagnosis present

## 2024-01-30 DIAGNOSIS — Z794 Long term (current) use of insulin: Secondary | ICD-10-CM

## 2024-01-30 DIAGNOSIS — E876 Hypokalemia: Secondary | ICD-10-CM | POA: Diagnosis not present

## 2024-01-30 DIAGNOSIS — E11649 Type 2 diabetes mellitus with hypoglycemia without coma: Secondary | ICD-10-CM | POA: Diagnosis present

## 2024-01-30 DIAGNOSIS — E119 Type 2 diabetes mellitus without complications: Secondary | ICD-10-CM

## 2024-01-30 DIAGNOSIS — Z7985 Long-term (current) use of injectable non-insulin antidiabetic drugs: Secondary | ICD-10-CM

## 2024-01-30 DIAGNOSIS — I160 Hypertensive urgency: Secondary | ICD-10-CM | POA: Diagnosis present

## 2024-01-30 DIAGNOSIS — R000 Tachycardia, unspecified: Secondary | ICD-10-CM | POA: Diagnosis present

## 2024-01-30 DIAGNOSIS — Z79899 Other long term (current) drug therapy: Secondary | ICD-10-CM | POA: Diagnosis not present

## 2024-01-30 DIAGNOSIS — E1110 Type 2 diabetes mellitus with ketoacidosis without coma: Secondary | ICD-10-CM | POA: Diagnosis present

## 2024-01-30 DIAGNOSIS — Z8249 Family history of ischemic heart disease and other diseases of the circulatory system: Secondary | ICD-10-CM

## 2024-01-30 DIAGNOSIS — F1990 Other psychoactive substance use, unspecified, uncomplicated: Secondary | ICD-10-CM | POA: Diagnosis present

## 2024-01-30 DIAGNOSIS — I129 Hypertensive chronic kidney disease with stage 1 through stage 4 chronic kidney disease, or unspecified chronic kidney disease: Secondary | ICD-10-CM | POA: Diagnosis present

## 2024-01-30 DIAGNOSIS — E785 Hyperlipidemia, unspecified: Secondary | ICD-10-CM | POA: Diagnosis present

## 2024-01-30 DIAGNOSIS — E1122 Type 2 diabetes mellitus with diabetic chronic kidney disease: Secondary | ICD-10-CM | POA: Diagnosis present

## 2024-01-30 DIAGNOSIS — Z91148 Patient's other noncompliance with medication regimen for other reason: Secondary | ICD-10-CM

## 2024-01-30 DIAGNOSIS — Z720 Tobacco use: Secondary | ICD-10-CM | POA: Diagnosis present

## 2024-01-30 DIAGNOSIS — N1832 Chronic kidney disease, stage 3b: Secondary | ICD-10-CM | POA: Diagnosis present

## 2024-01-30 DIAGNOSIS — J439 Emphysema, unspecified: Secondary | ICD-10-CM | POA: Diagnosis present

## 2024-01-30 DIAGNOSIS — F1490 Cocaine use, unspecified, uncomplicated: Secondary | ICD-10-CM | POA: Diagnosis present

## 2024-01-30 LAB — GLUCOSE, CAPILLARY
Glucose-Capillary: 567 mg/dL (ref 70–99)
Glucose-Capillary: 493 mg/dL — ABNORMAL HIGH (ref 70–99)
Glucose-Capillary: 378 mg/dL — ABNORMAL HIGH (ref 70–99)
Glucose-Capillary: 314 mg/dL — ABNORMAL HIGH (ref 70–99)
Glucose-Capillary: 234 mg/dL — ABNORMAL HIGH (ref 70–99)
Glucose-Capillary: 195 mg/dL — ABNORMAL HIGH (ref 70–99)

## 2024-01-30 LAB — URINALYSIS, ROUTINE W REFLEX MICROSCOPIC
Bilirubin Urine: NEGATIVE
Glucose, UA: >=500 mg/dL — AB
Ketones, ur: 20 mg/dL — AB
Nitrite: NEGATIVE
Protein, ur: NEGATIVE mg/dL
Specific Gravity, Urine: 1.02 (ref 1.005–1.030)
pH: 5 (ref 5.0–8.0)

## 2024-01-30 LAB — CBC WITH DIFFERENTIAL/PLATELET
Abs Immature Granulocytes: 0.04 10*3/uL (ref 0.00–0.07)
Basophils Absolute: 0 10*3/uL (ref 0.0–0.1)
Basophils Relative: 0 %
Eosinophils Absolute: 0 10*3/uL (ref 0.0–0.5)
Eosinophils Relative: 0 %
HCT: 50 % (ref 39.0–52.0)
Hemoglobin: 17.5 g/dL — ABNORMAL HIGH (ref 13.0–17.0)
Immature Granulocytes: 0 %
Lymphocytes Relative: 2 %
Lymphs Abs: 0.4 10*3/uL — ABNORMAL LOW (ref 0.7–4.0)
MCH: 25.8 pg — ABNORMAL LOW (ref 26.0–34.0)
MCHC: 35 g/dL (ref 30.0–36.0)
MCV: 73.6 fL — ABNORMAL LOW (ref 80.0–100.0)
Monocytes Absolute: 0.6 10*3/uL (ref 0.1–1.0)
Monocytes Relative: 4 %
Neutro Abs: 14.5 10*3/uL — ABNORMAL HIGH (ref 1.7–7.7)
Neutrophils Relative %: 94 %
Platelets: 234 10*3/uL (ref 150–400)
RBC: 6.79 MIL/uL — ABNORMAL HIGH (ref 4.22–5.81)
RDW: 18 % — ABNORMAL HIGH (ref 11.5–15.5)
WBC: 15.5 10*3/uL — ABNORMAL HIGH (ref 4.0–10.5)
nRBC: 0.2 % (ref 0.0–0.2)

## 2024-01-30 LAB — BASIC METABOLIC PANEL WITH GFR
Anion gap: 17 — ABNORMAL HIGH (ref 5–15)
Anion gap: 16 — ABNORMAL HIGH (ref 5–15)
Anion gap: 13 (ref 5–15)
BUN: 81 mg/dL — ABNORMAL HIGH (ref 8–23)
BUN: 80 mg/dL — ABNORMAL HIGH (ref 8–23)
BUN: 70 mg/dL — ABNORMAL HIGH (ref 8–23)
CO2: 17 mmol/L — ABNORMAL LOW (ref 22–32)
CO2: 19 mmol/L — ABNORMAL LOW (ref 22–32)
CO2: 19 mmol/L — ABNORMAL LOW (ref 22–32)
Calcium: 8.7 mg/dL — ABNORMAL LOW (ref 8.9–10.3)
Calcium: 8.7 mg/dL — ABNORMAL LOW (ref 8.9–10.3)
Calcium: 8.7 mg/dL — ABNORMAL LOW (ref 8.9–10.3)
Chloride: 92 mmol/L — ABNORMAL LOW (ref 98–111)
Chloride: 96 mmol/L — ABNORMAL LOW (ref 98–111)
Chloride: 101 mmol/L (ref 98–111)
Creatinine, Ser: 4.15 mg/dL — ABNORMAL HIGH (ref 0.61–1.24)
Creatinine, Ser: 3.81 mg/dL — ABNORMAL HIGH (ref 0.61–1.24)
Creatinine, Ser: 3.24 mg/dL — ABNORMAL HIGH (ref 0.61–1.24)
GFR, Estimated: 15 mL/min — ABNORMAL LOW (ref >60)
GFR, Estimated: 16 mL/min — ABNORMAL LOW (ref >60)
GFR, Estimated: 20 mL/min — ABNORMAL LOW (ref >60)
Glucose, Bld: 690 mg/dL (ref 70–99)
Glucose, Bld: 417 mg/dL — ABNORMAL HIGH (ref 70–99)
Glucose, Bld: 186 mg/dL — ABNORMAL HIGH (ref 70–99)
Potassium: 3.9 mmol/L (ref 3.5–5.1)
Potassium: 3.9 mmol/L (ref 3.5–5.1)
Potassium: 3.7 mmol/L (ref 3.5–5.1)
Sodium: 126 mmol/L — ABNORMAL LOW (ref 135–145)
Sodium: 131 mmol/L — ABNORMAL LOW (ref 135–145)
Sodium: 133 mmol/L — ABNORMAL LOW (ref 135–145)

## 2024-01-30 LAB — BLOOD GAS, VENOUS
Acid-base deficit: 15.6 mmol/L — ABNORMAL HIGH (ref 0.0–2.0)
Bicarbonate: 12.6 mmol/L — ABNORMAL LOW (ref 20.0–28.0)
O2 Saturation: 66.5 %
Patient temperature: 37
pCO2, Ven: 37 mmHg — ABNORMAL LOW (ref 44–60)
pH, Ven: 7.14 — CL (ref 7.25–7.43)
pO2, Ven: 44 mmHg (ref 32–45)

## 2024-01-30 LAB — COMPREHENSIVE METABOLIC PANEL WITH GFR
ALT: 34 U/L (ref 0–44)
AST: 26 U/L (ref 15–41)
Albumin: 4.1 g/dL (ref 3.5–5.0)
Alkaline Phosphatase: 115 U/L (ref 38–126)
Anion gap: >20 — ABNORMAL HIGH (ref 5–15)
BUN: 85 mg/dL — ABNORMAL HIGH (ref 8–23)
CO2: 12 mmol/L — ABNORMAL LOW (ref 22–32)
Calcium: 8.8 mg/dL — ABNORMAL LOW (ref 8.9–10.3)
Chloride: 78 mmol/L — ABNORMAL LOW (ref 98–111)
Creatinine, Ser: 4.65 mg/dL — ABNORMAL HIGH (ref 0.61–1.24)
GFR, Estimated: 13 mL/min — ABNORMAL LOW (ref >60)
Glucose, Bld: >1200 mg/dL (ref 70–99)
Potassium: 6.6 mmol/L (ref 3.5–5.1)
Sodium: 118 mmol/L — CL (ref 135–145)
Total Bilirubin: 1 mg/dL (ref 0.0–1.2)
Total Protein: 8.2 g/dL — ABNORMAL HIGH (ref 6.5–8.1)

## 2024-01-30 LAB — I-STAT CG4 LACTIC ACID, ED: Lactic Acid, Venous: 6.5 mmol/L (ref 0.5–1.9)

## 2024-01-30 LAB — BETA-HYDROXYBUTYRIC ACID
Beta-Hydroxybutyric Acid: >8 mmol/L — ABNORMAL HIGH (ref 0.05–0.27)
Beta-Hydroxybutyric Acid: 1.77 mmol/L — ABNORMAL HIGH (ref 0.05–0.27)
Beta-Hydroxybutyric Acid: 0.29 mmol/L — ABNORMAL HIGH (ref 0.05–0.27)
Beta-Hydroxybutyric Acid: 0.12 mmol/L (ref 0.05–0.27)

## 2024-01-30 LAB — MAGNESIUM: Magnesium: 2.4 mg/dL (ref 1.7–2.4)

## 2024-01-30 LAB — CBG MONITORING, ED
Glucose-Capillary: >600 mg/dL (ref 70–99)
Glucose-Capillary: >600 mg/dL (ref 70–99)
Glucose-Capillary: >600 mg/dL (ref 70–99)
Glucose-Capillary: >600 mg/dL (ref 70–99)

## 2024-01-30 LAB — HIV ANTIBODY (ROUTINE TESTING W REFLEX): HIV Screen 4th Generation wRfx: NONREACTIVE

## 2024-01-30 LAB — MRSA NEXT GEN BY PCR, NASAL: MRSA by PCR Next Gen: NOT DETECTED

## 2024-01-30 LAB — PHOSPHORUS: Phosphorus: 3.9 mg/dL (ref 2.5–4.6)

## 2024-01-30 MED ORDER — LACTATED RINGERS IV BOLUS
20.0000 mL/kg | Freq: Once | INTRAVENOUS | Status: AC
  Administered 2024-01-30: 1180 mL via INTRAVENOUS

## 2024-01-30 MED ORDER — ACETAMINOPHEN 650 MG RE SUPP: 650.0000 mg | Freq: Four times a day (QID) | RECTAL | Status: DC | PRN

## 2024-01-30 MED ORDER — ENOXAPARIN SODIUM 30 MG/0.3ML IJ SOSY
30.0000 mg | PREFILLED_SYRINGE | Freq: Every day | INTRAMUSCULAR | Status: DC
  Administered 2024-01-30 – 2024-02-01 (×3): 30 mg via SUBCUTANEOUS
  Filled 2024-01-30 (×3): qty 0.3

## 2024-01-30 MED ORDER — SODIUM CHLORIDE 0.45 % IV BOLUS
1000.0000 mL | Freq: Once | INTRAVENOUS | Status: AC
  Administered 2024-01-30: 1000 mL via INTRAVENOUS

## 2024-01-30 MED ORDER — SODIUM ZIRCONIUM CYCLOSILICATE 10 G PO PACK
10.0000 g | PACK | ORAL | Status: AC
Start: 2024-01-30 — End: 2024-01-30
  Administered 2024-01-30: 10 g via ORAL
  Filled 2024-01-30: qty 1

## 2024-01-30 MED ORDER — ACETAMINOPHEN 325 MG PO TABS
650.0000 mg | ORAL_TABLET | Freq: Four times a day (QID) | ORAL | Status: DC | PRN
  Administered 2024-01-31 (×2): 650 mg via ORAL
  Filled 2024-01-30 (×2): qty 2

## 2024-01-30 MED ORDER — ORAL CARE MOUTH RINSE
15.0000 mL | OROMUCOSAL | Status: DC
  Administered 2024-01-30 – 2024-02-02 (×9): 15 mL via OROMUCOSAL

## 2024-01-30 MED ORDER — DEXTROSE 50 % IV SOLN: 0.0000 mL | INTRAVENOUS | Status: DC | PRN

## 2024-01-30 MED ORDER — HYDRALAZINE HCL 20 MG/ML IJ SOLN
10.0000 mg | Freq: Once | INTRAMUSCULAR | Status: AC
  Administered 2024-01-30: 10 mg via INTRAVENOUS
  Filled 2024-01-30: qty 1

## 2024-01-30 MED ORDER — INSULIN REGULAR(HUMAN) IN NACL 100-0.9 UT/100ML-% IV SOLN
INTRAVENOUS | Status: DC
  Administered 2024-01-30: 5.5 [IU]/h via INTRAVENOUS
  Administered 2024-01-31: 1.5 [IU]/h via INTRAVENOUS
  Filled 2024-01-30 (×2): qty 100

## 2024-01-30 MED ORDER — ONDANSETRON HCL 4 MG/2ML IJ SOLN
4.0000 mg | Freq: Four times a day (QID) | INTRAMUSCULAR | Status: DC | PRN
  Administered 2024-02-01 (×3): 4 mg via INTRAVENOUS
  Filled 2024-01-30 (×3): qty 2

## 2024-01-30 MED ORDER — CHLORHEXIDINE GLUCONATE CLOTH 2 % EX PADS
6.0000 | MEDICATED_PAD | Freq: Every day | CUTANEOUS | Status: DC
  Administered 2024-01-31 – 2024-02-02 (×3): 6 via TOPICAL

## 2024-01-30 MED ORDER — AMLODIPINE BESYLATE 10 MG PO TABS
5.0000 mg | ORAL_TABLET | Freq: Every day | ORAL | Status: DC
  Administered 2024-01-30 – 2024-02-02 (×4): 5 mg via ORAL
  Filled 2024-01-30 (×4): qty 1

## 2024-01-30 MED ORDER — LACTATED RINGERS IV SOLN: INTRAVENOUS | Status: AC

## 2024-01-30 MED ORDER — DEXTROSE IN LACTATED RINGERS 5 % IV SOLN: INTRAVENOUS | Status: AC

## 2024-01-30 MED ORDER — ONDANSETRON HCL 4 MG PO TABS: 4.0000 mg | ORAL_TABLET | Freq: Four times a day (QID) | ORAL | Status: DC | PRN

## 2024-01-30 MED ORDER — HYDRALAZINE HCL 20 MG/ML IJ SOLN
10.0000 mg | INTRAMUSCULAR | Status: DC | PRN
  Administered 2024-01-30 – 2024-01-31 (×2): 10 mg via INTRAVENOUS
  Filled 2024-01-30 (×2): qty 1

## 2024-01-30 MED ORDER — ORAL CARE MOUTH RINSE: 15.0000 mL | OROMUCOSAL | Status: DC | PRN

## 2024-01-30 NOTE — ED Notes (Signed)
 Next CBG check 1510 for endo tool

## 2024-01-30 NOTE — ED Triage Notes (Addendum)
 BIB EMS due to weakness and hypertension, pt has not been taking meds in about a week, no appetite x 4 days, 210/150- Used Cocaine 3 days ago. CBG 380

## 2024-01-30 NOTE — ED Provider Notes (Signed)
 [facility] EMERGENCY DEPARTMENT AT [facility] Provider Note   CSN: [id number] Arrival date & time: 01/30/24  4098     History  Chief Complaint  Patient presents with   Weakness   Hypertension   Hyperglycemia    Dominic Molina is a 68 y.o. male.  68 year old male presents with weakness x 4 to 5 days.  Patient states that he has diabetes hypertension and has been noncompliant with his medications.  Also uses cocaine as well.  Denies any chest pain or severe headaches.  States his weakness has been nonfocal.  Endorses polyuria but denies any dysuria.  No flank pain noted.  No abdominal discomfort.  Denies any syncope.  States his last blood pressure medication use was about 5 days ago      Home Medications Prior to Admission medications   Medication Sig Start Date End Date Taking? Authorizing Provider  amLODipine (NORVASC) 10 MG tablet Take 1 tablet (10 mg total) by mouth daily. 01/18/23   Burnadette Pop, MD  atorvastatin (LIPITOR) 20 MG tablet Take 20 mg by mouth at bedtime. 12/27/22   [provider]  Blood Glucose Monitoring Suppl DEVI 1 each by Does not apply route in the morning, at noon, and at bedtime. May substitute to any manufacturer covered by patient's insurance. 01/17/23   Burnadette Pop, MD  Blood Pressure KIT 1 each by Does not apply route 2 (two) times daily. 01/17/23   Burnadette Pop, MD  hydrALAZINE (APRESOLINE) 50 MG tablet Take 1 tablet (50 mg total) by mouth 3 (three) times daily. 01/17/23   Burnadette Pop, MD  insulin aspart (NOVOLOG FLEXPEN) 100 UNIT/ML FlexPen Inject 4 Units into the skin 3 (three) times daily with meals. Patient not taking: Reported on 02/16/2023 01/17/23   Burnadette Pop, MD  insulin glargine (LANTUS) 100 UNIT/ML Solostar Pen Inject 30 Units into the skin daily. 01/17/23   Burnadette Pop, MD  insulin lispro (HUMALOG) 100 UNIT/ML injection Inject 4 Units into the skin 3 (three) times daily before meals.    [provider]  Insulin Pen Needle (PEN NEEDLES 31GX5/16") 31G X 8 MM MISC 1 each by Does not apply route 3 (three) times daily. 01/17/23   Burnadette Pop, MD  losartan (COZAAR) 100 MG tablet Take 1 tablet (100 mg total) by mouth daily. 01/18/23   Burnadette Pop, MD      Allergies    Patient has no known allergies.    Review of Systems   Review of Systems  All other systems reviewed and are negative.   Physical Exam Updated Vital Signs BP (!) 190/112 (BP Location: Left Arm)   Pulse 100   Temp (!) 97.5 F (36.4 C) (Oral)   Resp 18   Ht 1.702 m (5\' 7" )   Wt 59 kg   SpO2 100%   BMI 20.36 kg/m  Physical Exam Vitals and nursing note reviewed.  Constitutional:      General: He is not in acute distress.    Appearance: Normal appearance. He is well-developed. He is not toxic-appearing.  HENT:     Head: Normocephalic and atraumatic.  Eyes:     General: Lids are normal.     Conjunctiva/sclera: Conjunctivae normal.     Pupils: Pupils are equal, round, and reactive to light.  Neck:     Thyroid: No thyroid mass.     Trachea: No tracheal deviation.  Cardiovascular:     Rate and Rhythm: Normal rate and regular rhythm.  Heart sounds: Normal heart sounds. No murmur heard.    No gallop.  Pulmonary:     Effort: Pulmonary effort is normal. No respiratory distress.     Breath sounds: Normal breath sounds. No stridor. No decreased breath sounds, wheezing, rhonchi or rales.  Abdominal:     General: There is no distension.     Palpations: Abdomen is soft.     Tenderness: There is no abdominal tenderness. There is no rebound.  Musculoskeletal:        General: No tenderness. Normal range of motion.     Cervical back: Normal range of motion and neck supple.  Skin:    General: Skin is warm and dry.     Findings: No abrasion or rash.  Neurological:     Mental Status: He is alert and oriented to person, place, and time. Mental status is at baseline.     GCS: GCS eye subscore is 4. GCS  verbal subscore is 5. GCS motor subscore is 6.     Cranial Nerves: No cranial nerve deficit.     Sensory: No sensory deficit.     Motor: Motor function is intact.  Psychiatric:        Attention and Perception: Attention normal.        Speech: Speech normal.        Behavior: Behavior normal.    ED Results / Procedures / Treatments   Labs (all labs ordered are listed, but only abnormal results are displayed) Labs Reviewed  BLOOD GAS, VENOUS - Abnormal; Notable for the following components:      Result Value   pH, Ven 7.14 (*)    pCO2, Ven 37 (*)    Bicarbonate 12.6 (*)    Acid-base deficit 15.6 (*)    All other components within normal limits  CBG MONITORING, ED - Abnormal; Notable for the following components:   Glucose-Capillary >600 (*)    All other components within normal limits  I-STAT CG4 LACTIC ACID, ED - Abnormal; Notable for the following components:   Lactic Acid, Venous 6.5 (*)    All other components within normal limits  CBC WITH DIFFERENTIAL/PLATELET  COMPREHENSIVE METABOLIC PANEL WITH GFR    EKG EKG Interpretation Date/Time:  Tuesday January 30 2024 08:12:03 EDT Ventricular Rate:  100 PR Interval:  132 QRS Duration:  103 QT Interval:  360 QTC Calculation: 465 R Axis:   87  Text Interpretation: Sinus tachycardia Right atrial enlargement Borderline right axis deviation Probable left ventricular hypertrophy Nonspecific T abnormalities, lateral leads No significant change since last tracing Confirmed by Lorre Nick ([id number]) on 01/30/2024 8:53:02 AM  Radiology No results found.  Procedures Procedures    Medications Ordered in ED Medications - No data to display  ED Course/ Medical Decision Making/ A&P                                 Medical Decision Making Amount and/or Complexity of Data Reviewed Labs: ordered. Radiology: ordered.  Risk Prescription drug management. Decision regarding hospitalization.   The patient's EKG shows sinus  tachycardia.  Patient treated with IV fluids.  Also found to be slightly hypertensive and given IV hydralazine.  Patient has not been compliant with his antihypertensive medication.  Chest x-ray without acute findings.  Blood sugars over 600.  Patient's labs consistent with DKA.  Started on insulin drip.  Has evidence of worsening kidney function and discussed with nephrology who  recommends IV fluids and insulin at this point.  Case discussed with hospitalist team and will be admitted to stepdown  CRITICAL CARE Performed by: Toy Baker Total critical care time: 55 minutes Critical care time was exclusive of separately billable procedures and treating other patients. Critical care was necessary to treat or prevent imminent or life-threatening deterioration. Critical care was time spent personally by me on the following activities: development of treatment plan with patient and/or surrogate as well as nursing, discussions with consultants, evaluation of patient's response to treatment, examination of patient, obtaining history from patient or surrogate, ordering and performing treatments and interventions, ordering and review of laboratory studies, ordering and review of radiographic studies, pulse oximetry and re-evaluation of patient's condition.         Final Clinical Impression(s) / ED Diagnoses Final diagnoses:  None    Rx / DC Orders ED Discharge Orders     None         Lorre Nick, MD 01/30/24 1118

## 2024-01-30 NOTE — Inpatient Diabetes Management (Addendum)
 Inpatient Diabetes Program Recommendations  AACE/ADA: New Consensus Statement on Inpatient Glycemic Control (2015)  Target Ranges:  Prepandial:   less than 140 mg/dL      Peak postprandial:   less than 180 mg/dL (1-2 hours)      Critically ill patients:  140 - 180 mg/dL   Lab Results  Component Value Date   GLUCAP >600 (HH) 01/30/2024   HGBA1C >15.5 (H) 01/15/2023    Review of Glycemic Control  Diabetes history: DM 2 Outpatient Diabetes medications: Ozempic,metformin, Lantus 30 units Daily, Humalog 4 units tid meal coverage.  Current orders for Inpatient glycemic control:  IV insulin D5 LR 125 ml/hour  Spoke with pt at bedside regarding him being without his medications specifically insulin prior to admission. Pt reports having a PCP and 2-3 weeks ago was started on ozempic (Dr. Selena Batten?) and metformin again and told to stop his insulin. Pt reports needing more testing supplies at time of d/c , but cannot tell me what meter he has. Spoke with pt about needin a portion of his insulin at time of d/c. Pt does not seem appropriate to be on Ozempic. I also spoke with pt regarding cocaine increasing glucose levels significantly when used. Encouraged PCP follow up to follow up on medications and testing supplies needed as his PCP would know which supplies is needed.  If restarted on insulin pt reports having both Lantus and Humalog at home.  Thanks,  Christena Deem RN, MSN, BC-ADM Inpatient Diabetes Coordinator Team Pager [phone] (8a-5p)

## 2024-01-30 NOTE — Plan of Care (Signed)
  Problem: Education: Goal: Ability to describe self-care measures that may prevent or decrease complications (Diabetes Survival Skills Education) will improve Outcome: Progressing Goal: Individualized Educational Video(s) Outcome: Progressing   Problem: Coping: Goal: Ability to adjust to condition or change in health will improve Outcome: Progressing   Problem: Fluid Volume: Goal: Ability to maintain a balanced intake and output will improve Outcome: Progressing   Problem: Health Behavior/Discharge Planning: Goal: Ability to identify and utilize available resources and services will improve Outcome: Progressing Goal: Ability to manage health-related needs will improve Outcome: Progressing   Problem: Metabolic: Goal: Ability to maintain appropriate glucose levels will improve Outcome: Progressing   Problem: Nutritional: Goal: Maintenance of adequate nutrition will improve Outcome: Progressing Goal: Progress toward achieving an optimal weight will improve Outcome: Progressing   Problem: Skin Integrity: Goal: Risk for impaired skin integrity will decrease Outcome: Progressing   Problem: Tissue Perfusion: Goal: Adequacy of tissue perfusion will improve Outcome: Progressing   Problem: Education: Goal: Ability to describe self-care measures that may prevent or decrease complications (Diabetes Survival Skills Education) will improve Outcome: Progressing

## 2024-01-30 NOTE — H&P (Signed)
 History and Physical    Patient: Dominic Molina NWG:[id number] DOB: [birth year] DOA: 01/30/2024 DOS: the patient was seen and examined on 01/30/2024 PCP: System, Provider Not In  Patient coming from: Home  Chief Complaint:  Chief Complaint  Patient presents with   Weakness   Hypertension   Hyperglycemia   HPI: Dominic Molina is a 68 y.o. male with medical history significant of type 2 diabetes, polysubstance abuse, heroin, cocaine, tobacco who presented to the emergency department with generalized weakness, hypertension and hyperglycemia.  He has not had his insulin or other medications in about a week.  He has been polyuric, polydipsia again has had blurry vision.  Denied polyphagia.  He denied fever, chills, rhinorrhea, sore throat, wheezing or hemoptysis.  No chest pain, palpitations, diaphoresis, PND, orthopnea or pitting edema of the lower extremities.  No abdominal pain, nausea, emesis, diarrhea, constipation, melena or hematochezia.  No flank pain, dysuria, frequency or hematuria.    Lab work: Urinalysis showed greater than 500 glucose and ketones of 20 mg/dL.  Small hemoglobin, small leukocyte esterase and rare bacteria.  Venous blood gas with a pH of 7.14.  CBC showed white count of 15.5, hemoglobin 17.5 g/dL platelets 578.  CMP showed initial sodium 118, potassium 6.6, chloride 78 and CO2 12 mmol/L with an anion gap greater than 20.  Glucose was greater than 1200, BUN 85, creatinine 4.65 and corrected calcium 8.7 mg/dL.  LFTs were normal with the exception of total protein 8.2 g/dL.  Beta hydroxybutyric acid was greater than 8.00 mmol/L.  Imaging: Portable 1 view chest radiograph with no acute finding, but showing early signs of emphysema.   ED course: Initial vital signs were temperature 97.5 F, pulse 100, respiration 18, BP 190/112 mmHg O2 sat 100% on room air.  The patient received IV fluids, 10 g of Lokelma and was started on insulin infusion.  I added 1000 mL of half NS bolus over  2 hours.  Review of Systems: As mentioned in the history of present illness. All other systems reviewed and are negative.  Past Medical History:  Diagnosis Date   Diabetes mellitus without complication (HCC)    Heroin overdose (HCC) 12/17/2016   Polysubstance abuse (HCC)    History reviewed. No pertinent surgical history. Social History:  reports that he has been smoking. He has never used smokeless tobacco. He reports current alcohol use. He reports current drug use. Drugs: Cocaine, Marijuana, and Heroin.  No Known Allergies  Family History  Problem Relation Age of Onset   Hypertension Other     Prior to Admission medications   Medication Sig Start Date End Date Taking? Authorizing Provider  amLODipine (NORVASC) 10 MG tablet Take 1 tablet (10 mg total) by mouth daily. 01/18/23   Burnadette Pop, MD  atorvastatin (LIPITOR) 20 MG tablet Take 20 mg by mouth at bedtime. 12/27/22   [provider]  Blood Glucose Monitoring Suppl DEVI 1 each by Does not apply route in the morning, at noon, and at bedtime. May substitute to any manufacturer covered by patient's insurance. 01/17/23   Burnadette Pop, MD  Blood Pressure KIT 1 each by Does not apply route 2 (two) times daily. 01/17/23   Burnadette Pop, MD  hydrALAZINE (APRESOLINE) 50 MG tablet Take 1 tablet (50 mg total) by mouth 3 (three) times daily. 01/17/23   Burnadette Pop, MD  insulin aspart (NOVOLOG FLEXPEN) 100 UNIT/ML FlexPen Inject 4 Units into the skin 3 (three) times daily with meals. Patient not taking: Reported  on 02/16/2023 01/17/23   Burnadette Pop, MD  insulin glargine (LANTUS) 100 UNIT/ML Solostar Pen Inject 30 Units into the skin daily. 01/17/23   Burnadette Pop, MD  insulin lispro (HUMALOG) 100 UNIT/ML injection Inject 4 Units into the skin 3 (three) times daily before meals.    [provider]  Insulin Pen Needle (PEN NEEDLES 31GX5/16") 31G X 8 MM MISC 1 each by Does not apply route 3 (three) times daily.  01/17/23   Burnadette Pop, MD  losartan (COZAAR) 100 MG tablet Take 1 tablet (100 mg total) by mouth daily. 01/18/23   Burnadette Pop, MD    Physical Exam: Vitals:   01/30/24 0816 01/30/24 0858 01/30/24 0915 01/30/24 1030  BP:  (!) 175/135 (!) 168/101 (!) 144/98  Pulse:   98 98  Resp:   (!) 27 20  Temp:      TempSrc:      SpO2:   99% 98%  Weight: 59 kg     Height: 5\' 7"  (1.702 m)      Physical Exam Vitals and nursing note reviewed.  Constitutional:      Appearance: Normal appearance. He is ill-appearing.  HENT:     Head: Normocephalic.     Nose: No rhinorrhea.     Mouth/Throat:     Mouth: Mucous membranes are dry.  Eyes:     General: No scleral icterus.    Pupils: Pupils are equal, round, and reactive to light.  Cardiovascular:     Rate and Rhythm: Normal rate and regular rhythm.  Pulmonary:     Effort: Pulmonary effort is normal.     Breath sounds: Normal breath sounds.  Abdominal:     General: Bowel sounds are normal. There is no distension.     Palpations: Abdomen is soft.     Tenderness: There is no abdominal tenderness. There is no right CVA tenderness, left CVA tenderness or guarding.  Musculoskeletal:     Cervical back: Neck supple.     Right lower leg: No edema.     Left lower leg: No edema.  Skin:    General: Skin is warm and dry.     Findings: Abrasion present.  Neurological:     General: No focal deficit present.     Mental Status: He is alert and oriented to person, place, and time.  Psychiatric:        Mood and Affect: Mood normal.        Behavior: Behavior normal.     Data Reviewed:  Results are pending, will review when available. EKG: Vent. rate 100 BPM PR interval 132 ms QRS duration 103 ms QT/QTcB 360/465 ms P-R-T axes 84 87 85 Sinus tachycardia Right atrial enlargement Borderline right axis deviation Probable left ventricular hypertrophy Nonspecific T abnormalities, lateral leads  Assessment and Plan: Principal Problem:   Type 2  diabetes mellitus (HCC) Presenting with:   DKA (diabetic ketoacidosis) (HCC) Observation/stepdown. Keep NPO. Continue IV fluids. Continue insulin infusion. Monitor CBG closely. BMP every 4 hours. BHA every 4 hours. Replace electrolytes as needed. Consult diabetes coordinator. Transition to SQ insulin per Endo tool.  Active Problems:   Hyperkalemia Continue DKA treatment. Also received Lokelma. Follow potassium level.    AKI (acute kidney injury) (HCC) Continue IV fluids. Hold ARB/ACE. Avoid hypotension. Avoid nephrotoxins. Monitor intake and output. Monitor renal function electrolytes.    Hyperlipidemia Currently not taking atorvastatin.    Tobacco abuse Tobacco cessation advised. Nicotine replacement therapy may be ordered as needed.  Advance Care Planning:   Code Status: Full Code   Consults:   Family Communication:   Severity of Illness: The appropriate patient status for this patient is INPATIENT. Inpatient status is judged to be reasonable and necessary in order to provide the required intensity of service to ensure the patient's safety. The patient's presenting symptoms, physical exam findings, and initial radiographic and laboratory data in the context of their chronic comorbidities is felt to place them at high risk for further clinical deterioration. Furthermore, it is not anticipated that the patient will be medically stable for discharge from the hospital within 2 midnights of admission.   * I certify that at the point of admission it is my clinical judgment that the patient will require inpatient hospital care spanning beyond 2 midnights from the point of admission due to high intensity of service, high risk for further deterioration and high frequency of surveillance required.*  Author: Bobette Mo, MD 01/30/2024 11:18 AM  For on call review [email or web address].   This document was prepared using Dragon voice recognition software and may contain  some unintended transcription errors.

## 2024-01-30 NOTE — ED Notes (Signed)
 EDP and RN notified of pts Istat lactic acid.

## 2024-01-31 DIAGNOSIS — E1110 Type 2 diabetes mellitus with ketoacidosis without coma: Secondary | ICD-10-CM | POA: Diagnosis not present

## 2024-01-31 LAB — GLUCOSE, CAPILLARY
Glucose-Capillary: 145 mg/dL — ABNORMAL HIGH (ref 70–99)
Glucose-Capillary: 160 mg/dL — ABNORMAL HIGH (ref 70–99)
Glucose-Capillary: 160 mg/dL — ABNORMAL HIGH (ref 70–99)
Glucose-Capillary: 167 mg/dL — ABNORMAL HIGH (ref 70–99)
Glucose-Capillary: 168 mg/dL — ABNORMAL HIGH (ref 70–99)
Glucose-Capillary: 174 mg/dL — ABNORMAL HIGH (ref 70–99)
Glucose-Capillary: 174 mg/dL — ABNORMAL HIGH (ref 70–99)
Glucose-Capillary: 176 mg/dL — ABNORMAL HIGH (ref 70–99)
Glucose-Capillary: 183 mg/dL — ABNORMAL HIGH (ref 70–99)
Glucose-Capillary: 186 mg/dL — ABNORMAL HIGH (ref 70–99)
Glucose-Capillary: 188 mg/dL — ABNORMAL HIGH (ref 70–99)
Glucose-Capillary: 209 mg/dL — ABNORMAL HIGH (ref 70–99)
Glucose-Capillary: 214 mg/dL — ABNORMAL HIGH (ref 70–99)
Glucose-Capillary: 221 mg/dL — ABNORMAL HIGH (ref 70–99)
Glucose-Capillary: 233 mg/dL — ABNORMAL HIGH (ref 70–99)
Glucose-Capillary: >600 mg/dL (ref 70–99)

## 2024-01-31 LAB — HEMOGLOBIN A1C
Hgb A1c MFr Bld: >15.5 % — ABNORMAL HIGH (ref 4.8–5.6)
Mean Plasma Glucose: >398 mg/dL

## 2024-01-31 LAB — COMPREHENSIVE METABOLIC PANEL WITH GFR
ALT: 20 U/L (ref 0–44)
AST: 20 U/L (ref 15–41)
Albumin: 2.4 g/dL — ABNORMAL LOW (ref 3.5–5.0)
Alkaline Phosphatase: 58 U/L (ref 38–126)
Anion gap: 13 (ref 5–15)
BUN: 58 mg/dL — ABNORMAL HIGH (ref 8–23)
CO2: 17 mmol/L — ABNORMAL LOW (ref 22–32)
Calcium: 7.9 mg/dL — ABNORMAL LOW (ref 8.9–10.3)
Chloride: 101 mmol/L (ref 98–111)
Creatinine, Ser: 2.68 mg/dL — ABNORMAL HIGH (ref 0.61–1.24)
GFR, Estimated: 25 mL/min — ABNORMAL LOW (ref >60)
Glucose, Bld: 738 mg/dL (ref 70–99)
Potassium: 3.4 mmol/L — ABNORMAL LOW (ref 3.5–5.1)
Sodium: 131 mmol/L — ABNORMAL LOW (ref 135–145)
Total Bilirubin: 0.5 mg/dL (ref 0.0–1.2)
Total Protein: 4.9 g/dL — ABNORMAL LOW (ref 6.5–8.1)

## 2024-01-31 LAB — CBC
HCT: 44.3 % (ref 39.0–52.0)
Hemoglobin: 15.5 g/dL (ref 13.0–17.0)
MCH: 25 pg — ABNORMAL LOW (ref 26.0–34.0)
MCHC: 35 g/dL (ref 30.0–36.0)
MCV: 71.6 fL — ABNORMAL LOW (ref 80.0–100.0)
Platelets: 167 10*3/uL (ref 150–400)
RBC: 6.19 MIL/uL — ABNORMAL HIGH (ref 4.22–5.81)
RDW: 16.6 % — ABNORMAL HIGH (ref 11.5–15.5)
WBC: 4.5 10*3/uL (ref 4.0–10.5)
nRBC: 0 % (ref 0.0–0.2)

## 2024-01-31 LAB — BASIC METABOLIC PANEL WITH GFR
Anion gap: 11 (ref 5–15)
Anion gap: 10 (ref 5–15)
BUN: 64 mg/dL — ABNORMAL HIGH (ref 8–23)
BUN: 65 mg/dL — ABNORMAL HIGH (ref 8–23)
CO2: 19 mmol/L — ABNORMAL LOW (ref 22–32)
CO2: 20 mmol/L — ABNORMAL LOW (ref 22–32)
Calcium: 8.3 mg/dL — ABNORMAL LOW (ref 8.9–10.3)
Calcium: 8.6 mg/dL — ABNORMAL LOW (ref 8.9–10.3)
Chloride: 100 mmol/L (ref 98–111)
Chloride: 104 mmol/L (ref 98–111)
Creatinine, Ser: 2.87 mg/dL — ABNORMAL HIGH (ref 0.61–1.24)
Creatinine, Ser: 2.87 mg/dL — ABNORMAL HIGH (ref 0.61–1.24)
GFR, Estimated: 23 mL/min — ABNORMAL LOW (ref >60)
GFR, Estimated: 23 mL/min — ABNORMAL LOW (ref >60)
Glucose, Bld: 326 mg/dL — ABNORMAL HIGH (ref 70–99)
Glucose, Bld: 137 mg/dL — ABNORMAL HIGH (ref 70–99)
Potassium: 3.6 mmol/L (ref 3.5–5.1)
Potassium: 3.6 mmol/L (ref 3.5–5.1)
Sodium: 130 mmol/L — ABNORMAL LOW (ref 135–145)
Sodium: 134 mmol/L — ABNORMAL LOW (ref 135–145)

## 2024-01-31 LAB — LACTIC ACID, PLASMA: Lactic Acid, Venous: 1.9 mmol/L (ref 0.5–1.9)

## 2024-01-31 LAB — BETA-HYDROXYBUTYRIC ACID: Beta-Hydroxybutyric Acid: 0.12 mmol/L (ref 0.05–0.27)

## 2024-01-31 MED ORDER — METOCLOPRAMIDE HCL 5 MG/ML IJ SOLN
5.0000 mg | Freq: Three times a day (TID) | INTRAMUSCULAR | Status: DC | PRN
  Administered 2024-01-31 – 2024-02-02 (×4): 5 mg via INTRAVENOUS
  Filled 2024-01-31 (×4): qty 2

## 2024-01-31 MED ORDER — ATORVASTATIN CALCIUM 20 MG PO TABS
20.0000 mg | ORAL_TABLET | Freq: Every day | ORAL | Status: DC
  Administered 2024-01-31 – 2024-02-01 (×2): 20 mg via ORAL
  Filled 2024-01-31 (×2): qty 1

## 2024-01-31 MED ORDER — INSULIN ASPART 100 UNIT/ML IJ SOLN
0.0000 [IU] | Freq: Three times a day (TID) | INTRAMUSCULAR | Status: DC
  Administered 2024-01-31: 3 [IU] via SUBCUTANEOUS
  Administered 2024-02-01: 5 [IU] via SUBCUTANEOUS
  Administered 2024-02-01: 2 [IU] via SUBCUTANEOUS

## 2024-01-31 MED ORDER — LACTATED RINGERS IV SOLN: INTRAVENOUS | Status: AC

## 2024-01-31 MED ORDER — DEXTROSE IN LACTATED RINGERS 5 % IV SOLN: INTRAVENOUS | Status: DC

## 2024-01-31 MED ORDER — POTASSIUM CHLORIDE CRYS ER 20 MEQ PO TBCR
20.0000 meq | EXTENDED_RELEASE_TABLET | Freq: Once | ORAL | Status: AC
  Administered 2024-01-31: 20 meq via ORAL
  Filled 2024-01-31: qty 1

## 2024-01-31 MED ORDER — INSULIN GLARGINE-YFGN 100 UNIT/ML ~~LOC~~ SOLN
20.0000 [IU] | Freq: Every day | SUBCUTANEOUS | Status: DC
Start: 2024-01-31 — End: 2024-02-01
  Administered 2024-01-31 – 2024-02-01 (×2): 20 [IU] via SUBCUTANEOUS
  Filled 2024-01-31 (×2): qty 0.2

## 2024-01-31 MED ORDER — HYDRALAZINE HCL 50 MG PO TABS
50.0000 mg | ORAL_TABLET | Freq: Three times a day (TID) | ORAL | Status: DC
Start: 2024-01-31 — End: 2024-02-02
  Administered 2024-01-31 – 2024-02-02 (×6): 50 mg via ORAL
  Filled 2024-01-31 (×6): qty 1

## 2024-01-31 MED ORDER — INSULIN ASPART 100 UNIT/ML IJ SOLN
3.0000 [IU] | Freq: Three times a day (TID) | INTRAMUSCULAR | Status: DC
  Administered 2024-02-01 (×2): 3 [IU] via SUBCUTANEOUS

## 2024-01-31 MED ORDER — INSULIN ASPART 100 UNIT/ML IJ SOLN: 0.0000 [IU] | Freq: Every day | INTRAMUSCULAR | Status: DC

## 2024-01-31 MED ORDER — LIDOCAINE 5 % EX PTCH
1.0000 | MEDICATED_PATCH | Freq: Once | CUTANEOUS | Status: AC
  Administered 2024-01-31: 1 via TRANSDERMAL
  Filled 2024-01-31: qty 1

## 2024-01-31 MED ORDER — NICOTINE 7 MG/24HR TD PT24
7.0000 mg | MEDICATED_PATCH | Freq: Once | TRANSDERMAL | Status: AC
  Administered 2024-01-31: 7 mg via TRANSDERMAL
  Filled 2024-01-31: qty 1

## 2024-01-31 NOTE — Plan of Care (Signed)
  Problem: Coping: Goal: Ability to adjust to condition or change in health will improve Outcome: Progressing   Problem: Skin Integrity: Goal: Risk for impaired skin integrity will decrease Outcome: Progressing   Problem: Respiratory: Goal: Will regain and/or maintain adequate ventilation Outcome: Progressing   Problem: Clinical Measurements: Goal: Respiratory complications will improve Outcome: Progressing Goal: Cardiovascular complication will be avoided Outcome: Progressing   Problem: Metabolic: Goal: Ability to maintain appropriate glucose levels will improve Outcome: Not Progressing   Problem: Nutritional: Goal: Maintenance of adequate nutrition will improve Outcome: Not Progressing

## 2024-01-31 NOTE — Progress Notes (Signed)
 Pt transferred to 1444 from SDU.

## 2024-01-31 NOTE — Plan of Care (Signed)
  Problem: Education: Goal: Ability to describe self-care measures that may prevent or decrease complications (Diabetes Survival Skills Education) will improve Outcome: Progressing Goal: Individualized Educational Video(s) Outcome: Progressing   Problem: Fluid Volume: Goal: Ability to maintain a balanced intake and output will improve Outcome: Progressing   Problem: Health Behavior/Discharge Planning: Goal: Ability to identify and utilize available resources and services will improve Outcome: Progressing Goal: Ability to manage health-related needs will improve Outcome: Progressing   Problem: Metabolic: Goal: Ability to maintain appropriate glucose levels will improve Outcome: Progressing   Problem: Nutritional: Goal: Maintenance of adequate nutrition will improve Outcome: Progressing

## 2024-01-31 NOTE — Progress Notes (Signed)
 PROGRESS NOTE Dominic Molina  Dominic Molina: 04-09-1956 DOA: 01/30/2024 PCP: System, Provider Not In  Brief Narrative/Hospital Course: 49 yom w/ T2DM on Lantus 30 units daily, NovoLog 4 units 3 times daily, Ozempic, metformin, Lipitor, polysubstance/heroin/Cocaine and , tobacco abuse who presented to the ED with generalized weakness, polydipsia, blurry vision,and hyperglycemia and has not had his insulin or other medications in about a week. In the ED found to be in DKA blood sugar > 1200, vbg a pH of 7.14.  Leukocytosis hemoconcentrated elevated hemoglobin AKI hyperkalemia, pseudohyponatremia/hyponatremia, admitted after IV fluid bolus and insulin drip.  Chest x-ray emphysema UA with WBC 11-20 leukocyte small nitrate negative ketone 20.  Subjective: Seen and examined He is resting No complaint Overnight mildly tachycardic afebrile BP stable on room air Labs this morning blood sugar in 160s anion gap had close at 13  creatinine down to 2.6 bicarb at 17-19  Assessment and plan:  DKA due to noncompliance Type 2 diabetes on long-term insulin with uncontrolled hyperglycemia: Ran out of insulin and meds for a week and admitted with uncontrolled hyperglycemia of > 1200 and DKA.  Anion gap has closed recheck BMP pending- will transition to basal bolus insulin regimen, reinforced education, DM CoORDINATOR has been consulted follow-up A1c. Counseled extensively for the need to be compliant.  He was not taking Premeal insulin and was only doing long-acting and he is not sure of the dose. Recent Labs  Lab 01/31/24 0532 01/31/24 0656 01/31/24 0808 01/31/24 0939 01/31/24 1045  GLUCAP 183* 168* 160* 174* 145*    Hyperkalemia/hypokalemia Hyponatremia/pseudohyponatremia: Resolved, replace potassium.  Lactic acidosis: Likely from DKA and dehydration.  Will recheck to check resolution  AKI on CKD 3b: B/l creat 1.9- 2.2 in march 24, peak 4.6 due to volume loss from acute hypoglycemia ,  improving with IV fluids Recent Labs    01/30/24 0827 01/30/24 1543 01/30/24 1835 01/30/24 2235 01/31/24 0258  BUN 85* 81* 80* 70* 58*  64*  CREATININE 4.65* 4.15* 3.81* 3.24* 2.68*  2.87*  CO2 12* 17* 19* 19* 17*  19*  K 6.6* 3.9 3.9 3.7 3.4*  3.6    Hyperlipidemia: Not taking statin.  Polysubstance abuse history Tobacco abuse: Cessation counseling, nicotine patch    Subjective: Seen and examined, Overnight vitals/labs/events reviewed>   Assessment and Plan:  DVT prophylaxis: enoxaparin (LOVENOX) injection 30 mg Start: 01/30/24 2200 Code Status:   Code Status: Full Code Family Communication: plan of care discussed with patient at bedside. Patient status is: Remains hospitalized because of severity of illness Level of care: Stepdown   Dispo: The patient is from: home            Anticipated disposition: TBD  Objective: Vitals last 24 hrs: Vitals:   01/31/24 0900 01/31/24 1000 01/31/24 1046 01/31/24 1100  BP: (!) 136/98 (!) 129/91  (!) 158/93  Pulse: (!) 114 (!) 108  (!) 112  Resp: (!) 21 20  20   Temp:   98.1 F (36.7 C)   TempSrc:   Oral   SpO2: 92% 93%  94%  Weight:      Height:       Weight change:   Physical Examination: General exam: alert awake, older than stated age HEENT:Oral mucosa moist, Ear/Nose WNL grossly Respiratory system: Bilaterally diminished BS, no use of accessory muscle Cardiovascular system: S1 & S2 +. Gastrointestinal system: Abdomen soft, NT,ND,BS+ Nervous System: Alert, awake,following commands. Extremities: LE edema neg, moving Extremities, warm legs Skin: No rashes,warm. MSK: Normal muscle bulk/tone.  Medications reviewed:  Scheduled Meds:  amLODipine  5 mg Oral Daily   Chlorhexidine Gluconate Cloth  6 each Topical Q0600   enoxaparin (LOVENOX) injection  30 mg Subcutaneous QHS   insulin aspart  0-15 Units Subcutaneous TID WC   insulin aspart  0-5 Units Subcutaneous QHS   insulin aspart  3 Units Subcutaneous TID WC    insulin glargine-yfgn  20 Units Subcutaneous Daily   lidocaine  1 patch Transdermal Once   nicotine  7 mg Transdermal Once   mouth rinse  15 mL Mouth Rinse 4 times per day   Continuous Infusions:  dextrose 5% lactated ringers 125 mL/hr at 01/31/24 1112   insulin 0.8 Units/hr (01/31/24 1112)    Diet Order             Diet Carb Modified Fluid consistency: Thin; Room service appropriate? Yes  Diet effective now                   Intake/Output Summary (Last 24 hours) at 01/31/2024 1200 Last data filed at 01/31/2024 1112 Gross per 24 hour  Intake 4229.02 ml  Output 450 ml  Net 3779.02 ml   Net IO Since Admission: 5,279.02 mL [01/31/24 1200]  Wt Readings from Last 3 Encounters:  01/30/24 59 kg  02/16/23 59.9 kg  01/14/23 54 kg     Unresulted Labs (From admission, onward)     Start     Ordered   02/01/24 0500  Basic metabolic panel with GFR  Daily,   R     Question:  Specimen collection method  Answer:  Lab=Lab collect   01/31/24 0751   01/31/24 1105  Lactic acid, plasma  (Lactic Acid)  Once,   R       Question:  Specimen collection method  Answer:  Lab=Lab collect   01/31/24 1104          Data Reviewed: I have personally reviewed following labs and imaging studies ( see epic result tab) CBC: Recent Labs  Lab 01/30/24 0827 01/31/24 0258  WBC 15.5* 4.5  NEUTROABS 14.5*  --   HGB 17.5* 15.5  HCT 50.0 44.3  MCV 73.6* 71.6*  PLT 234 167   CMP: Recent Labs  Lab 01/30/24 1543 01/30/24 1835 01/30/24 2235 01/31/24 0258 01/31/24 1039  NA 126* 131* 133* 131*  130* 134*  K 3.9 3.9 3.7 3.4*  3.6 3.6  CL 92* 96* 101 101  100 104  CO2 17* 19* 19* 17*  19* 20*  GLUCOSE 690* 417* 186* 738*  326* 137*  BUN 81* 80* 70* 58*  64* 65*  CREATININE 4.15* 3.81* 3.24* 2.68*  2.87* 2.87*  CALCIUM 8.7* 8.7* 8.7* 7.9*  8.3* 8.6*  MG 2.4  --   --   --   --   PHOS 3.9  --   --   --   --    GFR: Estimated Creatinine Clearance: 20.3 mL/min (A) (by C-G formula based on SCr  of 2.87 mg/dL (H)). Recent Labs  Lab 01/30/24 0827 01/31/24 0258  AST 26 20  ALT 34 20  ALKPHOS 115 58  BILITOT 1.0 0.5  PROT 8.2* 4.9*  ALBUMIN 4.1 2.4*   Recent Labs  Lab 01/31/24 0532 01/31/24 0656 01/31/24 0808 01/31/24 0939 01/31/24 1045  GLUCAP 183* 168* 160* 174* 145*   Recent Labs  Lab 01/30/24 0835  LATICACIDVEN 6.5*   Recent Results (from the past 240 hours)  MRSA Next Gen by PCR, Nasal  Status: None   Collection Time: 01/30/24  3:02 PM   Specimen: Nasal Mucosa; Nasal Swab  Result Value Ref Range Status   MRSA by PCR Next Gen NOT DETECTED NOT DETECTED Final    Comment: (NOTE) The GeneXpert MRSA Assay (FDA approved for NASAL specimens only), is one component of a comprehensive MRSA colonization surveillance program. It is not intended to diagnose MRSA infection nor to guide or monitor treatment for MRSA infections. Test performance is not FDA approved in patients less than 57 years old. Performed at [facility], [address] [place], [place] [address]      Antimicrobials/Microbiology: Anti-infectives (From admission, onward)    None         Component Value Date/Time   SDES URINE, RANDOM 12/17/2016 0320   SPECREQUEST NONE 12/17/2016 0320   CULT NO GROWTH 12/17/2016 0320   REPTSTATUS 12/18/2016 FINAL 12/17/2016 0320     Radiology Studies: [facility] Chest Port 1 View Result Date: 01/30/2024 CLINICAL DATA:  Weakness and hypertension. EXAM: PORTABLE CHEST 1 VIEW COMPARISON:  02/02/2022 FINDINGS: Artifact from EKG leads. Normal heart size and mediastinal contours. No acute infiltrate or edema. No effusion or pneumothorax. Apical lucency and architectural distortion which may reflect emphysema. No acute osseous findings. IMPRESSION: No acute finding. Suspect emphysema. Electronically Signed   By: Tiburcio Pea M.D.   On: 01/30/2024 10:43     LOS: 1 day   Total time spent in review of labs and imaging, patient evaluation,  formulation of plan, documentation and communication with patient/family: 50 minutes  Lanae Boast, MD [facility] 01/31/2024, 12:00 PM

## 2024-01-31 NOTE — Hospital Course (Addendum)
 67 yom w/ T2DM on Lantus 30 units daily, NovoLog 4 units 3 times daily, Ozempic, metformin, Lipitor, polysubstance/heroin/Cocaine and , tobacco abuse who presented to the ED with generalized weakness, polydipsia, blurry vision,and hyperglycemia and has not had his insulin or other medications in about a week. In the ED found to be in DKA blood sugar > 1200, vbg a pH of 7.14.  Leukocytosis hemoconcentrated elevated hemoglobin AKI hyperkalemia, pseudohyponatremia/hyponatremia, admitted after IV fluid bolus and insulin drip.  Chest x-ray emphysema UA with WBC 11-20 leukocyte small nitrate negative ketone 20. DKA resolved, switched to long acting insulin and he is doing well at this time and he is requesting for discharge home this morning  Subjective: Seen examined " I want to go home" Overnight afebrile mild tachycardia BP stable  Patient was tachycardic with minimal ambulation yesterday and fatigueD Blood sugar has stabilized 90s to 120s  Discharge Diagnoses:  DKA due to noncompliance Type 2 diabetes on long-term insulin with uncontrolled hyperglycemia: Ran out of insulin and meds for a week and admitted with uncontrolled hyperglycemia of > 1200 and DKA. DKA resolved. A1c > 15.5.  Blood sugar well-controlled at this time-per oral intake marginal. Continue Semglee 28 units, and SSI. He states he has insulin at home and does not need new prescription ( I did send anyway to his pharmacy to makes sure he has enough). Also sent supplies rx. He is counseled extensively for the need to be compliant  Recent Labs  Lab 01/30/24 1543 01/30/24 1616 02/01/24 1629 02/01/24 2037 02/02/24 0023 02/02/24 0347 02/02/24 0755  GLUCAP  --    < > 89 128* 95 127* 93  HGBA1C >15.5*  --   --   --   --   --   --    < > = values in this interval not displayed.    Hyperkalemia/hypokalemia Hyponatremia/pseudohyponatremia: Resolved  Lactic acidosis: Likely from DKA and dehydration. Resolved  AKI on CKD  3b: B/l creat 1.9- 2.2 in march 24, peaked to 4.6 due to volume loss from DKA- improved in 2.5-likely his baseline Tolerating po well. Fu w/ PCP in 1 wk ot repeat labs Recent Labs    01/30/24 0827 01/30/24 1543 01/30/24 1835 01/30/24 2235 01/31/24 0258 01/31/24 1039 02/01/24 0551 02/02/24 0332  BUN 85* 81* 80* 70* 58*  64* 65* 58* 51*  CREATININE 4.65* 4.15* 3.81* 3.24* 2.68*  2.87* 2.87* 2.76* 2.51*  CO2 12* 17* 19* 19* 17*  19* 20* 19* 21*  K 6.6* 3.9 3.9 3.7 3.4*  3.6 3.6 4.5 3.6    Hyperlipidemia: Continue statin   Polysubstance abuse history Tobacco abuse: Cessation counseling done, continue nicotine patch

## 2024-01-31 NOTE — Inpatient Diabetes Management (Signed)
 Inpatient Diabetes Program Recommendations  AACE/ADA: New Consensus Statement on Inpatient Glycemic Control (2015)  Target Ranges:  Prepandial:   less than 140 mg/dL      Peak postprandial:   less than 180 mg/dL (1-2 hours)      Critically ill patients:  140 - 180 mg/dL   Lab Results  Component Value Date   GLUCAP 233 (H) 01/31/2024   HGBA1C >15.5 (H) 01/30/2024    Review of Glycemic Control  Diabetes history: DM2 Outpatient Diabetes medications: metformin 500 mg daily, Ozempic 0.25 weekly Current orders for Inpatient glycemic control: IV insulin transitioned to Semglee 20 units daily and Novolog 0-15 TID with meals and 0-5 HS + 3 units TID  HgbA1C - > 15.5%  Inpatient Diabetes Program Recommendations:    Just starting insulin SQ orders.  Spoke with pt at bedside regarding his diabetes and HgbA1C of > 15.5%. Pt states he's tired of everyone asking him questions. States he knew his HgbA1C was high. States PCP discontinued his Lantus although reports having both Lantus and Humalog at home. Pt appears uninterested in talking about his diabetes. Stressed to the patient the importance of improving glycemic control to prevent further complications from uncontrolled diabetes.  Discussed above with RN.  Will need assistance in abstaining from cocaine, to be able to take care of his diabetes.   Will continue to follow.  Thank you. Ailene Ards, RD, LDN, CDCES Inpatient Diabetes Coordinator [phone]

## 2024-02-01 DIAGNOSIS — E1110 Type 2 diabetes mellitus with ketoacidosis without coma: Secondary | ICD-10-CM | POA: Diagnosis not present

## 2024-02-01 LAB — BASIC METABOLIC PANEL WITH GFR
Anion gap: 11 (ref 5–15)
BUN: 58 mg/dL — ABNORMAL HIGH (ref 8–23)
CO2: 19 mmol/L — ABNORMAL LOW (ref 22–32)
Calcium: 8.6 mg/dL — ABNORMAL LOW (ref 8.9–10.3)
Chloride: 103 mmol/L (ref 98–111)
Creatinine, Ser: 2.76 mg/dL — ABNORMAL HIGH (ref 0.61–1.24)
GFR, Estimated: 24 mL/min — ABNORMAL LOW (ref >60)
Glucose, Bld: 222 mg/dL — ABNORMAL HIGH (ref 70–99)
Potassium: 4.5 mmol/L (ref 3.5–5.1)
Sodium: 133 mmol/L — ABNORMAL LOW (ref 135–145)

## 2024-02-01 LAB — GLUCOSE, CAPILLARY
Glucose-Capillary: 246 mg/dL — ABNORMAL HIGH (ref 70–99)
Glucose-Capillary: 133 mg/dL — ABNORMAL HIGH (ref 70–99)
Glucose-Capillary: 89 mg/dL (ref 70–99)
Glucose-Capillary: 128 mg/dL — ABNORMAL HIGH (ref 70–99)

## 2024-02-01 MED ORDER — INSULIN GLARGINE-YFGN 100 UNIT/ML ~~LOC~~ SOLN
28.0000 [IU] | Freq: Every day | SUBCUTANEOUS | Status: DC
  Administered 2024-02-02: 28 [IU] via SUBCUTANEOUS
  Filled 2024-02-01: qty 0.28

## 2024-02-01 MED ORDER — HYDROMORPHONE HCL 1 MG/ML IJ SOLN
0.5000 mg | Freq: Once | INTRAMUSCULAR | Status: AC
  Administered 2024-02-01: 0.5 mg via INTRAVENOUS
  Filled 2024-02-01: qty 0.5

## 2024-02-01 MED ORDER — INSULIN GLARGINE-YFGN 100 UNIT/ML ~~LOC~~ SOLN
8.0000 [IU] | SUBCUTANEOUS | Status: AC
Start: 2024-02-01 — End: 2024-02-01
  Administered 2024-02-01: 8 [IU] via SUBCUTANEOUS
  Filled 2024-02-01: qty 0.08

## 2024-02-01 MED ORDER — LACTATED RINGERS IV SOLN: INTRAVENOUS | Status: DC

## 2024-02-01 MED ORDER — INSULIN ASPART 100 UNIT/ML IJ SOLN
0.0000 [IU] | Freq: Three times a day (TID) | INTRAMUSCULAR | Status: DC
  Administered 2024-02-02: 2 [IU] via SUBCUTANEOUS

## 2024-02-01 NOTE — Progress Notes (Signed)
 PROGRESS NOTE Dominic Molina  ZOX:[id number] DOB: 1956-05-04 DOA: 01/30/2024 PCP: System, Provider Not In  Brief Narrative/Hospital Course: 24 yom w/ T2DM on Lantus 30 units daily, NovoLog 4 units 3 times daily, Ozempic, metformin, Lipitor, polysubstance/heroin/Cocaine and , tobacco abuse who presented to the ED with generalized weakness, polydipsia, blurry vision,and hyperglycemia and has not had his insulin or other medications in about a week. In the ED found to be in DKA blood sugar > 1200, vbg a pH of 7.14.  Leukocytosis hemoconcentrated elevated hemoglobin AKI hyperkalemia, pseudohyponatremia/hyponatremia, admitted after IV fluid bolus and insulin drip.  Chest x-ray emphysema UA with WBC 11-20 leukocyte small nitrate negative ketone 20.  Subjective: Seen and examined He is resting, states he has not been eating well No complaint Overnight mildly tachycardic afebrile BP stable on room air Labs this morning blood sugar in 160s anion gap had close at 13  creatinine down to 2.6 bicarb at 17-19  Assessment and plan:  DKA due to noncompliance Type 2 diabetes on long-term insulin with uncontrolled hyperglycemia: Ran out of insulin and meds for a week and admitted with uncontrolled hyperglycemia of > 1200 and DKA. Anion gap has closed transitioned to semglee > increased to 28 units, continue Premeal and SSI  follow-up A1c > 15.5. Counseled extensively for the need to be compliant  Hyperkalemia/hypokalemia Hyponatremia/pseudohyponatremia: Resolved  Lactic acidosis: Likely from DKA and dehydration. Resolved  AKI on CKD 3b: B/l creat 1.9- 2.2 in march 24, peak at 4.6 due to volume loss from DKA- improving-but holding in 2.7 g question if this is new baseline will keep on IV fluids 24 hours and monitor daily lab   Hyperlipidemia: Resumed statins nightly.  Polysubstance abuse history Tobacco abuse: Cessation counseling, nicotine patch    Subjective: Seen and examined, Overnight  vitals/labs/events reviewed>   Assessment and Plan:  DVT prophylaxis: enoxaparin (LOVENOX) injection 30 mg Start: 01/30/24 2200 Code Status:   Code Status: Full Code Family Communication: plan of care discussed with patient at bedside. Patient status is: Remains hospitalized because of severity of illness Level of care: Telemetry   Dispo: The patient is from: home            Anticipated disposition: TBD  Objective: Vitals last 24 hrs: Vitals:   01/31/24 2101 02/01/24 0706 02/01/24 0709 02/01/24 1107  BP: (!) 141/105 (!) 138/102 (!) 115/91 117/76  Pulse: (!) 110  (!) 109   Resp: 20 18 20 20   Temp: 98 F (36.7 C) 97.9 F (36.6 C) 98.9 F (37.2 C) 98.8 F (37.1 C)  TempSrc: Oral Oral  Oral  SpO2: 98% 97% 93% 98%  Weight:      Height:       Weight change:   Physical Examination: General exam: alert awake, oriented old looking  HEENT:Oral mucosa moist, Ear/Nose WNL grossly Respiratory system: Bilaterally clear BS,no use of accessory muscle Cardiovascular system: S1 & S2 +, No JVD. Gastrointestinal system: Abdomen soft,NT,ND, BS+ Nervous System: Alert, awake, moving all extremities,and following commands. Extremities: LE edema neg,distal peripheral pulses palpable and warm.  Skin: No rashes,no icterus. MSK: Normal muscle bulk,tone, power   Medications reviewed:  Scheduled Meds:  amLODipine  5 mg Oral Daily   atorvastatin  20 mg Oral QHS   Chlorhexidine Gluconate Cloth  6 each Topical Q0600   enoxaparin (LOVENOX) injection  30 mg Subcutaneous QHS   hydrALAZINE  50 mg Oral TID   insulin aspart  0-15 Units Subcutaneous TID WC   insulin aspart  0-5 Units Subcutaneous QHS   insulin aspart  3 Units Subcutaneous TID WC   [START ON 02/02/2024] insulin glargine-yfgn  28 Units Subcutaneous Daily   mouth rinse  15 mL Mouth Rinse 4 times per day   Continuous Infusions:  insulin Stopped (01/31/24 1432)   lactated ringers 125 mL/hr at 02/01/24 0830    Diet Order              Diet Carb Modified Fluid consistency: Thin; Room service appropriate? Yes  Diet effective now                   Intake/Output Summary (Last 24 hours) at 02/01/2024 1142 Last data filed at 02/01/2024 0828 Gross per 24 hour  Intake 1203.36 ml  Output 400 ml  Net 803.36 ml   Net IO Since Admission: 6,082.38 mL [02/01/24 1142]  Wt Readings from Last 3 Encounters:  01/30/24 59 kg  02/16/23 59.9 kg  01/14/23 54 kg     Unresulted Labs (From admission, onward)     Start     Ordered   02/01/24 0500  Basic metabolic panel with GFR  Daily,   R     Question:  Specimen collection method  Answer:  Lab=Lab collect   01/31/24 0751          Data Reviewed: I have personally reviewed following labs and imaging studies ( see epic result tab) CBC: Recent Labs  Lab 01/30/24 0827 01/31/24 0258  WBC 15.5* 4.5  NEUTROABS 14.5*  --   HGB 17.5* 15.5  HCT 50.0 44.3  MCV 73.6* 71.6*  PLT 234 167   CMP: Recent Labs  Lab 01/30/24 1543 01/30/24 1835 01/30/24 2235 01/31/24 0258 01/31/24 1039 02/01/24 0551  NA 126* 131* 133* 131*  130* 134* 133*  K 3.9 3.9 3.7 3.4*  3.6 3.6 4.5  CL 92* 96* 101 101  100 104 103  CO2 17* 19* 19* 17*  19* 20* 19*  GLUCOSE 690* 417* 186* 738*  326* 137* 222*  BUN 81* 80* 70* 58*  64* 65* 58*  CREATININE 4.15* 3.81* 3.24* 2.68*  2.87* 2.87* 2.76*  CALCIUM 8.7* 8.7* 8.7* 7.9*  8.3* 8.6* 8.6*  MG 2.4  --   --   --   --   --   PHOS 3.9  --   --   --   --   --    GFR: Estimated Creatinine Clearance: 21.1 mL/min (A) (by C-G formula based on SCr of 2.76 mg/dL (H)). Recent Labs  Lab 01/30/24 0827 01/31/24 0258  AST 26 20  ALT 34 20  ALKPHOS 115 58  BILITOT 1.0 0.5  PROT 8.2* 4.9*  ALBUMIN 4.1 2.4*   Recent Labs  Lab 01/31/24 1523 01/31/24 1655 01/31/24 2114 02/01/24 0801 02/01/24 1127  GLUCAP 188* 214* 186* 246* 133*   Recent Labs  Lab 01/30/24 0835 01/31/24 1146  LATICACIDVEN 6.5* 1.9   Recent Results (from the past 240 hours)   MRSA Next Gen by PCR, Nasal     Status: None   Collection Time: 01/30/24  3:02 PM   Specimen: Nasal Mucosa; Nasal Swab  Result Value Ref Range Status   MRSA by PCR Next Gen NOT DETECTED NOT DETECTED Final    Comment: (NOTE) The GeneXpert MRSA Assay (FDA approved for NASAL specimens only), is one component of a comprehensive MRSA colonization surveillance program. It is not intended to diagnose MRSA infection nor to guide or monitor treatment for MRSA infections. Test  performance is not FDA approved in patients less than 6 years old. Performed at [facility], [address], [place], [place] [address]      Antimicrobials/Microbiology: Anti-infectives (From admission, onward)    None         Component Value Date/Time   SDES URINE, RANDOM 12/17/2016 0320   SPECREQUEST NONE 12/17/2016 0320   CULT NO GROWTH 12/17/2016 0320   REPTSTATUS 12/18/2016 FINAL 12/17/2016 0320     Radiology Studies: No results found.  LOS: 2 days   Total time spent in review of labs and imaging, patient evaluation, formulation of plan, documentation and communication with patient/family: 35  minutes  Lanae Boast, MD [facility] 02/01/2024, 11:42 AM

## 2024-02-01 NOTE — Plan of Care (Signed)

## 2024-02-01 NOTE — TOC CM/SW Note (Addendum)
 Transition of Care [facility]) - Inpatient Brief Assessment   Patient Details  Name: Dominic Molina MRN: [id number] Date of Birth: March 30, 1956  Transition of [facility]) CM/SW Contact:    Larrie Kass, LCSW Phone Number: 02/01/2024, 3:40 PM   PCP resources added to pt's AVS.   Transition of Care Asessment: Insurance and Status: Insurance coverage has been reviewed  Home environment has been reviewed: home with self Prior level of function:: independent Prior/Current Home Services: No current home services Social Drivers of Health Review: SDOH reviewed no interventions necessary Readmission risk has been reviewed: Yes Transition of care needs: no transition of care needs at this time

## 2024-02-02 DIAGNOSIS — E1110 Type 2 diabetes mellitus with ketoacidosis without coma: Secondary | ICD-10-CM | POA: Diagnosis not present

## 2024-02-02 LAB — BASIC METABOLIC PANEL WITH GFR
Anion gap: 9 (ref 5–15)
BUN: 51 mg/dL — ABNORMAL HIGH (ref 8–23)
CO2: 21 mmol/L — ABNORMAL LOW (ref 22–32)
Calcium: 8.4 mg/dL — ABNORMAL LOW (ref 8.9–10.3)
Chloride: 102 mmol/L (ref 98–111)
Creatinine, Ser: 2.51 mg/dL — ABNORMAL HIGH (ref 0.61–1.24)
GFR, Estimated: 27 mL/min — ABNORMAL LOW (ref >60)
Glucose, Bld: 90 mg/dL (ref 70–99)
Potassium: 3.6 mmol/L (ref 3.5–5.1)
Sodium: 132 mmol/L — ABNORMAL LOW (ref 135–145)

## 2024-02-02 LAB — GLUCOSE, CAPILLARY
Glucose-Capillary: 127 mg/dL — ABNORMAL HIGH (ref 70–99)
Glucose-Capillary: 160 mg/dL — ABNORMAL HIGH (ref 70–99)
Glucose-Capillary: 93 mg/dL (ref 70–99)
Glucose-Capillary: 95 mg/dL (ref 70–99)

## 2024-02-02 MED ORDER — LANCETS MISC: 1.0000 | Freq: Three times a day (TID) | 0 refills | Status: DC

## 2024-02-02 MED ORDER — BLOOD GLUCOSE TEST VI STRP: 1.0000 | ORAL_STRIP | Freq: Three times a day (TID) | 0 refills | Status: DC

## 2024-02-02 MED ORDER — PEN NEEDLES 31G X 5 MM MISC: 1.0000 | Freq: Three times a day (TID) | 0 refills | Status: DC

## 2024-02-02 MED ORDER — LANCET DEVICE MISC
1.0000 | Freq: Three times a day (TID) | 0 refills | Status: DC
Start: 2024-02-02 — End: 2024-07-04

## 2024-02-02 MED ORDER — INSULIN GLARGINE 100 UNIT/ML SOLOSTAR PEN: 25.0000 [IU] | PEN_INJECTOR | Freq: Every day | SUBCUTANEOUS | 0 refills | Status: DC

## 2024-02-02 MED ORDER — BLOOD GLUCOSE MONITORING SUPPL DEVI: 1.0000 | Freq: Three times a day (TID) | 0 refills | Status: DC

## 2024-02-02 NOTE — Discharge Summary (Signed)
 Physician Discharge Summary  Dominic Molina MVH:[id number] DOB: 1955/12/19 DOA: 01/30/2024  PCP: System, Provider Not In  Admit date: 01/30/2024 Discharge date: 02/02/2024 Recommendations for Outpatient Follow-up:  Follow up with PCP in 1 weeks-call for appointment Please obtain BMP/CBC in one week  Discharge Dispo: Home Discharge Condition: Stable Code Status:   Code Status: Full Code Diet recommendation:  Diet Order             Diet Carb Modified Fluid consistency: Thin; Room service appropriate? Yes  Diet effective now                    Brief/Interim Summary: 67 yom w/ T2DM on Lantus 30 units daily, NovoLog 4 units 3 times daily, Ozempic, metformin, Lipitor, polysubstance/heroin/Cocaine and , tobacco abuse who presented to the ED with generalized weakness, polydipsia, blurry vision,and hyperglycemia and has not had his insulin or other medications in about a week. In the ED found to be in DKA blood sugar > 1200, vbg a pH of 7.14.  Leukocytosis hemoconcentrated elevated hemoglobin AKI hyperkalemia, pseudohyponatremia/hyponatremia, admitted after IV fluid bolus and insulin drip.  Chest x-ray emphysema UA with WBC 11-20 leukocyte small nitrate negative ketone 20. DKA resolved, switched to long acting insulin and he is doing well at this time and he is requesting for discharge home this morning  Subjective: Seen examined " I want to go home" Overnight afebrile mild tachycardia BP stable  Patient was tachycardic with minimal ambulation yesterday and fatigueD Blood sugar has stabilized 90s to 120s  Discharge Diagnoses:  DKA due to noncompliance Type 2 diabetes on long-term insulin with uncontrolled hyperglycemia: Ran out of insulin and meds for a week and admitted with uncontrolled hyperglycemia of > 1200 and DKA. DKA resolved. A1c > 15.5.  Blood sugar well-controlled at this time-per oral intake marginal. Continue Semglee 28 units, and SSI. He states he has insulin at home and  does not need new prescription ( I did send anyway to his pharmacy to makes sure he has enough). Also sent supplies rx. He is counseled extensively for the need to be compliant  Recent Labs  Lab 01/30/24 1543 01/30/24 1616 02/01/24 1629 02/01/24 2037 02/02/24 0023 02/02/24 0347 02/02/24 0755  GLUCAP  --    < > 89 128* 95 127* 93  HGBA1C >15.5*  --   --   --   --   --   --    < > = values in this interval not displayed.    Hyperkalemia/hypokalemia Hyponatremia/pseudohyponatremia: Resolved  Lactic acidosis: Likely from DKA and dehydration. Resolved  AKI on CKD 3b: B/l creat 1.9- 2.2 in march 24, peaked to 4.6 due to volume loss from DKA- improved in 2.5-likely his baseline Tolerating po well. Fu w/ PCP in 1 wk ot repeat labs Recent Labs    01/30/24 0827 01/30/24 1543 01/30/24 1835 01/30/24 2235 01/31/24 0258 01/31/24 1039 02/01/24 0551 02/02/24 0332  BUN 85* 81* 80* 70* 58*  64* 65* 58* 51*  CREATININE 4.65* 4.15* 3.81* 3.24* 2.68*  2.87* 2.87* 2.76* 2.51*  CO2 12* 17* 19* 19* 17*  19* 20* 19* 21*  K 6.6* 3.9 3.9 3.7 3.4*  3.6 3.6 4.5 3.6    Hyperlipidemia: Continue statin   Polysubstance abuse history Tobacco abuse: Cessation counseling done, continue nicotine patch   Discharge Exam: Vitals:   02/02/24 0754 02/02/24 1001  BP: (!) 138/93 (!) 155/103  Pulse: (!) 110   Resp: 20   Temp: 98.3 F (36.8  C)   SpO2: 99%    General: Pt is alert, awake, not in acute distress Cardiovascular: RRR, S1/S2 +, no rubs, no gallops Respiratory: CTA bilaterally, no wheezing, no rhonchi Abdominal: Soft, NT, ND, bowel sounds + Extremities: no edema, no cyanosis  Discharge Instructions  Discharge Instructions     Discharge instructions   Complete by: As directed    Please call call MD or return to ER for similar or worsening recurring problem that brought you to hospital or if any fever,nausea/vomiting,abdominal pain, uncontrolled pain, chest pain,  shortness of breath  or any other alarming symptoms.  Please follow-up your doctor as instructed in a week time and call the office for appointment.  Please avoid alcohol, smoking, or any other illicit substance and maintain healthy habits including taking your regular medications as prescribed.  You were cared for by a hospitalist during your hospital stay. If you have any questions about your discharge medications or the care you received while you were in the hospital after you are discharged, you can call the unit and ask to speak with the hospitalist on call if the hospitalist that took care of you is not available.  Once you are discharged, your primary care physician will handle any further medical issues. Please note that NO REFILLS for any discharge medications will be authorized once you are discharged, as it is imperative that you return to your primary care physician (or establish a relationship with a primary care physician if you do not have one) for your aftercare needs so that they can reassess your need for medications and monitor your lab values   Increase activity slowly   Complete by: As directed       Allergies as of 02/02/2024   No Known Allergies      Medication List     TAKE these medications    amLODipine 10 MG tablet Commonly known as: NORVASC Take 1 tablet (10 mg total) by mouth daily.   atorvastatin 20 MG tablet Commonly known as: LIPITOR Take 20 mg by mouth at bedtime.   Blood Glucose Monitoring Suppl Devi 1 each by Does not apply route in the morning, at noon, and at bedtime. May substitute to any manufacturer covered by patient's insurance. What changed: Another medication with the same name was added. Make sure you understand how and when to take each.   Blood Glucose Monitoring Suppl Devi 1 each by Does not apply route 3 (three) times daily. May dispense any manufacturer covered by patient's insurance. What changed: You were already taking a medication with the same  name, and this prescription was added. Make sure you understand how and when to take each.   BLOOD GLUCOSE TEST STRIPS Strp 1 each by Does not apply route 3 (three) times daily. Use as directed to check blood sugar. May dispense any manufacturer covered by patient's insurance and fits patient's device.   Blood Pressure Kit 1 each by Does not apply route 2 (two) times daily.   hydrALAZINE 50 MG tablet Commonly known as: APRESOLINE Take 1 tablet (50 mg total) by mouth 3 (three) times daily.   insulin glargine 100 UNIT/ML Solostar Pen Commonly known as: LANTUS Inject 25 Units into the skin daily. What changed: how much to take   Lancet Device Misc 1 each by Does not apply route 3 (three) times daily. May dispense any manufacturer covered by patient's insurance.   Lancets Misc 1 each by Does not apply route 3 (three) times daily. Use  as directed to check blood sugar. May dispense any manufacturer covered by patient's insurance and fits patient's device.   metFORMIN 500 MG 24 hr tablet Commonly known as: GLUCOPHAGE-XR Take 500 mg by mouth daily with breakfast.   NovoLOG FlexPen 100 UNIT/ML FlexPen Generic drug: insulin aspart Inject 4 Units into the skin 3 (three) times daily with meals.   OZEMPIC (0.25 OR 0.5 MG/DOSE) [place] Inject 0.5 mg into the skin every Monday.   PEN NEEDLES 31GX5/16" 31G X 8 MM Misc 1 each by Does not apply route 3 (three) times daily. What changed: Another medication with the same name was added. Make sure you understand how and when to take each.   Pen Needles 31G X 5 MM Misc 1 each by Does not apply route 3 (three) times daily. May dispense any manufacturer covered by patient's insurance. What changed: You were already taking a medication with the same name, and this prescription was added. Make sure you understand how and when to take each.        Follow-up Information     [facility] Follow up in 1 week(s).   Contact  information: [address] [place] [address] [phone]               No Known Allergies  The results of significant diagnostics from this hospitalization (including imaging, microbiology, ancillary and laboratory) are listed below for reference.    Microbiology: Recent Results (from the past 240 hours)  MRSA Next Gen by PCR, Nasal     Status: None   Collection Time: 01/30/24  3:02 PM   Specimen: Nasal Mucosa; Nasal Swab  Result Value Ref Range Status   MRSA by PCR Next Gen NOT DETECTED NOT DETECTED Final    Comment: (NOTE) The GeneXpert MRSA Assay (FDA approved for NASAL specimens only), is one component of a comprehensive MRSA colonization surveillance program. It is not intended to diagnose MRSA infection nor to guide or monitor treatment for MRSA infections. Test performance is not FDA approved in patients less than 74 years old. Performed at [facility], [address], [place], [place] [address]     Procedures/Studies: DG Chest Port 1 View Result Date: 01/30/2024 CLINICAL DATA:  Weakness and hypertension. EXAM: PORTABLE CHEST 1 VIEW COMPARISON:  02/02/2022 FINDINGS: Artifact from EKG leads. Normal heart size and mediastinal contours. No acute infiltrate or edema. No effusion or pneumothorax. Apical lucency and architectural distortion which may reflect emphysema. No acute osseous findings. IMPRESSION: No acute finding. Suspect emphysema. Electronically Signed   By: Tiburcio Pea M.D.   On: 01/30/2024 10:43    Labs: BNP (last 3 results) No results for input(s): "BNP" in the last 8760 hours. Basic Metabolic Panel: Recent Labs  Lab 01/30/24 1543 01/30/24 1835 01/30/24 2235 01/31/24 0258 01/31/24 1039 02/01/24 0551 02/02/24 0332  NA 126*   < > 133* 131*  130* 134* 133* 132*  K 3.9   < > 3.7 3.4*  3.6 3.6 4.5 3.6  CL 92*   < > 101 101  100 104 103 102  CO2 17*   < > 19* 17*  19* 20* 19* 21*  GLUCOSE  690*   < > 186* 738*  326* 137* 222* 90  BUN 81*   < > 70* 58*  64* 65* 58* 51*  CREATININE 4.15*   < > 3.24* 2.68*  2.87* 2.87* 2.76* 2.51*  CALCIUM 8.7*   < >  8.7* 7.9*  8.3* 8.6* 8.6* 8.4*  MG 2.4  --   --   --   --   --   --   PHOS 3.9  --   --   --   --   --   --    < > = values in this interval not displayed.   Liver Function Tests: Recent Labs  Lab 01/30/24 0827 01/31/24 0258  AST 26 20  ALT 34 20  ALKPHOS 115 58  BILITOT 1.0 0.5  PROT 8.2* 4.9*  ALBUMIN 4.1 2.4*   No results for input(s): "LIPASE", "AMYLASE" in the last 168 hours. No results for input(s): "AMMONIA" in the last 168 hours. CBC: Recent Labs  Lab 01/30/24 0827 01/31/24 0258  WBC 15.5* 4.5  NEUTROABS 14.5*  --   HGB 17.5* 15.5  HCT 50.0 44.3  MCV 73.6* 71.6*  PLT 234 167   Cardiac Enzymes: No results for input(s): "CKTOTAL", "CKMB", "CKMBINDEX", "TROPONINI" in the last 168 hours. BNP: Invalid input(s): "POCBNP" CBG: Recent Labs  Lab 02/01/24 2037 02/02/24 0023 02/02/24 0347 02/02/24 0755 02/02/24 1122  GLUCAP 128* 95 127* 93 160*   D-Dimer No results for input(s): "DDIMER" in the last 72 hours. Hgb A1c Recent Labs    01/30/24 1543  HGBA1C >15.5*   Lipid Profile No results for input(s): "CHOL", "HDL", "LDLCALC", "TRIG", "CHOLHDL", "LDLDIRECT" in the last 72 hours. Thyroid function studies No results for input(s): "TSH", "T4TOTAL", "T3FREE", "THYROIDAB" in the last 72 hours.  Invalid input(s): "FREET3" Anemia work up No results for input(s): "VITAMINB12", "FOLATE", "FERRITIN", "TIBC", "IRON", "RETICCTPCT" in the last 72 hours. Urinalysis    Component Value Date/Time   COLORURINE STRAW (A) 01/30/2024 1226   APPEARANCEUR HAZY (A) 01/30/2024 1226   LABSPEC 1.020 01/30/2024 1226   PHURINE 5.0 01/30/2024 1226   GLUCOSEU >=500 (A) 01/30/2024 1226   HGBUR SMALL (A) 01/30/2024 1226   BILIRUBINUR NEGATIVE 01/30/2024 1226   KETONESUR 20 (A) 01/30/2024 1226   PROTEINUR NEGATIVE  01/30/2024 1226   NITRITE NEGATIVE 01/30/2024 1226   LEUKOCYTESUR SMALL (A) 01/30/2024 1226   Sepsis Labs Recent Labs  Lab 01/30/24 0827 01/31/24 0258  WBC 15.5* 4.5   Microbiology Recent Results (from the past 240 hours)  MRSA Next Gen by PCR, Nasal     Status: None   Collection Time: 01/30/24  3:02 PM   Specimen: Nasal Mucosa; Nasal Swab  Result Value Ref Range Status   MRSA by PCR Next Gen NOT DETECTED NOT DETECTED Final    Comment: (NOTE) The GeneXpert MRSA Assay (FDA approved for NASAL specimens only), is one component of a comprehensive MRSA colonization surveillance program. It is not intended to diagnose MRSA infection nor to guide or monitor treatment for MRSA infections. Test performance is not FDA approved in patients less than 60 years old. Performed at [facility], [address] [place], [place] [address]      Time coordinating discharge: 35 minutes  SIGNED: Lanae Boast, MD  [facility] 02/02/2024, 12:39 PM  If 7PM-7AM, please contact night-coverage [email or web address]

## 2024-02-02 NOTE — Plan of Care (Signed)
  Problem: Education: Goal: Ability to describe self-care measures that may prevent or decrease complications (Diabetes Survival Skills Education) will improve Outcome: Progressing Goal: Individualized Educational Video(s) Outcome: Progressing   Problem: Coping: Goal: Ability to adjust to condition or change in health will improve Outcome: Progressing   Problem: Fluid Volume: Goal: Ability to maintain a balanced intake and output will improve Outcome: Progressing   Problem: Health Behavior/Discharge Planning: Goal: Ability to identify and utilize available resources and services will improve Outcome: Progressing Goal: Ability to manage health-related needs will improve Outcome: Progressing   Problem: Metabolic: Goal: Ability to maintain appropriate glucose levels will improve Outcome: Progressing   Problem: Nutritional: Goal: Maintenance of adequate nutrition will improve Outcome: Progressing Goal: Progress toward achieving an optimal weight will improve Outcome: Progressing   Problem: Skin Integrity: Goal: Risk for impaired skin integrity will decrease Outcome: Progressing   Problem: Tissue Perfusion: Goal: Adequacy of tissue perfusion will improve Outcome: Progressing   Problem: Education: Goal: Ability to describe self-care measures that may prevent or decrease complications (Diabetes Survival Skills Education) will improve Outcome: Progressing Goal: Individualized Educational Video(s) Outcome: Progressing   Problem: Cardiac: Goal: Ability to maintain an adequate cardiac output will improve Outcome: Progressing   Problem: Health Behavior/Discharge Planning: Goal: Ability to identify and utilize available resources and services will improve Outcome: Progressing Goal: Ability to manage health-related needs will improve Outcome: Progressing   Problem: Fluid Volume: Goal: Ability to achieve a balanced intake and output will improve Outcome: Progressing    Problem: Metabolic: Goal: Ability to maintain appropriate glucose levels will improve Outcome: Progressing   Problem: Fluid Volume: Goal: Ability to achieve a balanced intake and output will improve Outcome: Progressing   Problem: Fluid Volume: Goal: Ability to achieve a balanced intake and output will improve Outcome: Progressing   Problem: Nutritional: Goal: Maintenance of adequate nutrition will improve Outcome: Progressing Goal: Maintenance of adequate weight for body size and type will improve Outcome: Progressing   Problem: Respiratory: Goal: Will regain and/or maintain adequate ventilation Outcome: Progressing   Problem: Urinary Elimination: Goal: Ability to achieve and maintain adequate renal perfusion and functioning will improve Outcome: Progressing   Problem: Education: Goal: Knowledge of General Education information will improve Description: Including pain rating scale, medication(s)/side effects and non-pharmacologic comfort measures Outcome: Progressing   Problem: Health Behavior/Discharge Planning: Goal: Ability to manage health-related needs will improve Outcome: Progressing   Problem: Clinical Measurements: Goal: Ability to maintain clinical measurements within normal limits will improve Outcome: Progressing Goal: Will remain free from infection Outcome: Progressing Goal: Diagnostic test results will improve Outcome: Progressing Goal: Respiratory complications will improve Outcome: Progressing Goal: Cardiovascular complication will be avoided Outcome: Progressing   Problem: Activity: Goal: Risk for activity intolerance will decrease Outcome: Progressing   Problem: Nutrition: Goal: Adequate nutrition will be maintained Outcome: Progressing   Problem: Coping: Goal: Level of anxiety will decrease Outcome: Progressing   Problem: Elimination: Goal: Will not experience complications related to bowel motility Outcome: Progressing Goal: Will  not experience complications related to urinary retention Outcome: Progressing   Problem: Pain Managment: Goal: General experience of comfort will improve and/or be controlled Outcome: Progressing   Problem: Safety: Goal: Ability to remain free from injury will improve Outcome: Progressing   Problem: Skin Integrity: Goal: Risk for impaired skin integrity will decrease Outcome: Progressing

## 2024-02-02 NOTE — Progress Notes (Signed)
Discharge instructions given to patient questions ask and answered.  D Pasqual Farias RN 

## 2024-02-02 NOTE — Progress Notes (Signed)
 Patient requested to ambulate in the hallway at 0400. He ambulated with the walker and staff assist x about 127ft. Beginning the walk, he tolerated well but as we were returning to his room, he became more short of breath and anxious. Oxygen was applied via nasal cannula at 2L/M until he was able to breathe better. His O2 sat was 94% on RA upon return to his room but he stated that he felt O2 would ease his breathing. Oxygen was later removed and his is still satting at 94% on RA. Lungs are clear and diminished. He continues on tele where fluctuating respirations are observed up to the 30s and 40s. Respirations counted manually and are in the mid 20s with no distress noted. Patient was put back to bed, call bell in reach. Will continue to monitor.

## 2024-02-22 DEATH — deceased
